# Patient Record
Sex: Male | Born: 1955 | Race: White | Hispanic: No | Marital: Married | Smoking: Current every day smoker
Health system: Southern US, Community
[De-identification: ages and names within clinical notes are randomized; demographics above are authoritative.]

## PROBLEM LIST (undated history)

## (undated) DIAGNOSIS — I10 Essential (primary) hypertension: Secondary | ICD-10-CM

## (undated) HISTORY — DX: Essential (primary) hypertension: I10

---

## 2011-06-30 ENCOUNTER — Inpatient Hospital Stay: Payer: Self-pay | Admitting: Internal Medicine

## 2020-10-03 ENCOUNTER — Ambulatory Visit: Payer: Self-pay | Admitting: Urology

## 2020-10-03 ENCOUNTER — Encounter: Payer: Self-pay | Admitting: Urology

## 2020-10-03 ENCOUNTER — Other Ambulatory Visit: Payer: Self-pay

## 2020-10-03 VITALS — BP 162/90 | Ht 68.0 in | Wt 197.0 lb

## 2020-10-03 DIAGNOSIS — R972 Elevated prostate specific antigen [PSA]: Secondary | ICD-10-CM

## 2020-10-03 DIAGNOSIS — N529 Male erectile dysfunction, unspecified: Secondary | ICD-10-CM

## 2020-10-03 LAB — BLADDER SCAN AMB NON-IMAGING

## 2020-10-03 NOTE — Patient Instructions (Addendum)
Prostate Cancer Screening  Prostate cancer screening is a test that is done to check for the presence of prostate cancer in men. The prostate gland is a walnut-sized gland that is located below the bladder and in front of the rectum in males. The function of the prostate is to add fluid to semen during ejaculation. Prostate cancer is the second most common type of cancer in men. Who should have prostate cancer screening?  Screening recommendations vary based on age and other risk factors. Screening is recommended if:  You are older than age 55. If you are age 55-69, talk with your health care provider about your need for screening and how often screening should be done. Because most prostate cancers are slow growing and will not cause death, screening is generally reserved in this age group for men who have a 10-15-year life expectancy.  You are younger than age 55, and you have these risk factors: ? Being a black male or a male of African descent. ? Having a father, brother, or uncle who has been diagnosed with prostate cancer. The risk is higher if your family member's cancer occurred at an early age. Screening is not recommended if:  You are younger than age 40.  You are between the ages of 40 and 54 and you have no risk factors.  You are 65 years of age or older. At this age, the risks that screening can cause are greater than the benefits that it may provide. If you are at high risk for prostate cancer, your health care provider may recommend that you have screenings more often or that you start screening at a younger age. How is screening for prostate cancer done? The recommended prostate cancer screening test is a blood test called the prostate-specific antigen (PSA) test. PSA is a protein that is made in the prostate. As you age, your prostate naturally produces more PSA. Abnormally high PSA levels may be caused by:  Prostate cancer.  An enlarged prostate that is not caused by cancer  (benign prostatic hyperplasia, BPH). This condition is very common in older men.  A prostate gland infection (prostatitis). Depending on the PSA results, you may need more tests, such as:  A physical exam to check the size of your prostate gland.  Blood and imaging tests.  A procedure to remove tissue samples from your prostate gland for testing (biopsy). What are the benefits of prostate cancer screening?  Screening can help to identify cancer at an early stage, before symptoms start and when the cancer can be treated more easily.  There is a small chance that screening may lower your risk of dying from prostate cancer. The chance is small because prostate cancer is a slow-growing cancer, and most men with prostate cancer die from a different cause. What are the risks of prostate cancer screening? The main risk of prostate cancer screening is diagnosing and treating prostate cancer that would never have caused any symptoms or problems. This is called overdiagnosisand overtreatment. PSA screening cannot tell you if your PSA is high due to cancer or a different cause. A prostate biopsy is the only procedure to diagnose prostate cancer. Even the results of a biopsy may not tell you if your cancer needs to be treated. Slow-growing prostate cancer may not need any treatment other than monitoring, so diagnosing and treating it may cause unnecessary stress or other side effects. A prostate biopsy may also cause:  Infection or fever.  A false negative. This is   a result that shows that you do not have prostate cancer when you actually do have prostate cancer. Questions to ask your health care provider  When should I start prostate cancer screening?  What is my risk for prostate cancer?  How often do I need screening?  What type of screening tests do I need?  How do I get my test results?  What do my results mean?  Do I need treatment? Where to find more information  The American Cancer  Society: www.cancer.org  American Urological Association: www.auanet.org Contact a health care provider if:  You have difficulty urinating.  You have pain when you urinate or ejaculate.  You have blood in your urine or semen.  You have pain in your back or in the area of your prostate. Summary  Prostate cancer is a common type of cancer in men. The prostate gland is located below the bladder and in front of the rectum. This gland adds fluid to semen during ejaculation.  Prostate cancer screening may identify cancer at an early stage, when the cancer can be treated more easily.  The prostate-specific antigen (PSA) test is the recommended screening test for prostate cancer.  Discuss the risks and benefits of prostate cancer screening with your health care provider. If you are age 65 or older, the risks that screening can cause are greater than the benefits that it may provide. This information is not intended to replace advice given to you by your health care provider. Make sure you discuss any questions you have with your health care provider. Document Revised: 06/01/2019 Document Reviewed: 06/01/2019 Elsevier Patient Education  2020 Elsevier Inc.      Transrectal Prostate Biopsy Patient Education and Post Procedure Instructions    -Definition A prostate biopsy is the removal of a small amount of tissue from the prostate gland. The tissue is examined to determine whether there is cancer.  -Reasons for Procedure A prostate biopsy is usually done after an abnormal finding by: Digital rectal exam Prostate specific antigen (PSA) blood test A prostate biopsy is the only way to find out if cancer cells are present.  -Possible Complications Problems from the procedure are rare, but all procedures have some risk including: Infection Bruising or lengthy bleeding from the rectum, or in urine or semen Difficulty urinating Reactions to anesthesia Factors that may increase the  risk of complications include: Smoking History of bleeding disorders or easy bruising Use of any medications, over-the-counter medications, or herbal supplements Sensitivity or allergy to latex, medications, or anesthesia.  -Prior to Procedure Talk to your doctor about your medications. Blood thinning medications including aspirin should be stopped 1 week prior to procedure. If prescribed by your cardiologist we may need approval before stopping medications. Use a Fleets enema 2 hours before the procedure. Can be purchased at your pharmacy. Antibiotics will be administered in the clinic prior to procedure.  Please make sure you eat a light meal prior to coming in for your appointment. This can help prevent lightheadedness during the procedure and upset stomach from antibiotics. Please bring someone with you to the procedure to drive you home.  -Anesthesia Transrectal biopsy: Local anesthesia--Just the area that is being operated on is numbed using an injectable anesthetic.  -Description of the Procedure Transrectal biopsy--Your doctor will insert a small ultrasound device into the rectum. This device will produce sound waves to create an image of the prostate. These images will help guide placement of the needle. Your doctor will then insert the   needle through the wall of the rectum and into the prostate gland. The procedure should take approximately 15-30 minutes.  -Will It Hurt? You may have discomfort and soreness at the biopsy site. Pain and discomfort after the procedure can be managed with medications.  -Postoperative Care When you return home after the procedure, do the following to help ensure a smooth recovery: Stay hydrated. Drink plenty of fluids for the next few days. Avoid difficult physical activity the day and evening of the procedure. Keep in mind that you may see blood in your urine, stool, or semen for several days. Resume any medications that were stopped when you are  advised to do so.  After the sample is taken, it will be sent to a pathologist for examination under a microscope. This doctor will analyze the sample for cancer. You will be scheduled for an appointment to discuss results. If cancer is present, your doctor will work with you to develop a treatment plan.   -Call Your Doctor or Seek Immediate Medical Attention It is important to monitor your recovery. Alert your doctor to any problems. If any of the following occur, call your doctor or go to the emergency room: Fever 100.5 or greater within 1 week post procedure go directly to ER Call the office for: Blood in the urine more than 1 week or in semen for more than 6 weeks post-biopsy Pain that you cannot control with the medications you have been given Pain, burning, urgency, or frequency of urination Cough, shortness of breath, or chest pain- if severe go to ER Heavy rectal bleeding or bleeding that lasts more than 1 week after the biopsy If you have any questions or concerns please contact our office at [phone]  [facility] [address], [address] [place], [place] [address] [phone]      

## 2020-10-03 NOTE — Progress Notes (Signed)
   10/03/20 11:05 AM   Sean Reilly 12/17/1955 [id number]  CC: Elevated PSA  HPI: I saw Sean Reilly in urology clinic today for evaluation of an elevated PSA.  He is a 64 year old male with a 45-pack-year smoking history who was recently found to have a isolated elevated PSA of 21 on 09/16/2020.  Notably, he says he was riding a tractor the day before his PSA was drawn.  There are no prior PSA values to review.  He denies any significant urinary symptoms, and IPSS score today is 10 with quality of life mixed.  He also has trouble with erections over the last few years, and is rarely sexually active.  He denies any family history of prostate or breast cancer.  He denies any bone pain or weight loss.  He denies any prior urologic or abdominal surgeries  Physical Exam: BP (!) 162/90 (BP Location: Left Arm, Patient Position: Sitting, Cuff Size: Large)   Ht 5\' 8"  (1.727 m)   Wt 197 lb (89.4 kg)   BMI 29.95 kg/m    Constitutional:  Alert and oriented, No acute distress. Cardiovascular: No clubbing, cyanosis, or edema. Respiratory: Normal respiratory effort, no increased work of breathing. GI: Abdomen is soft, nontender, nondistended, no abdominal masses GU: Phallus with patent meatus, no lesions, testicles 20 cc and descended bilaterally DRE: 40 g, smooth, no nodules or masses  Laboratory Data: Reviewed, PSA 21 on 09/16/2020  Pertinent Imaging: None to review  Assessment & Plan:   64 year old relatively healthy male with a single elevated PSA of 21, no family history of prostate or breast cancer, and benign DRE.  We reviewed the implications of an elevated PSA and the uncertainty surrounding it. In general, a man's PSA increases with age and is produced by both normal and cancerous prostate tissue. The differential diagnosis for elevated PSA includes BPH, prostate cancer, infection, recent intercourse/ejaculation, recent urethroscopic manipulation (foley placement/cystoscopy) or  trauma, and prostatitis.   Management of an elevated PSA can include observation or prostate biopsy and we discussed this in detail. Our goal is to detect clinically significant prostate cancers, and manage with either active surveillance, surgery, or radiation for localized disease. Risks of prostate biopsy include bleeding, infection (including life threatening sepsis), pain, and lower urinary symptoms. Hematuria, hematospermia, and blood in the stool are all common after biopsy and can persist up to 4 weeks.   Repeat PSA today with reflex to free.  We discussed the need to move forward with biopsy if this remains significantly elevated, however if it is decreased significantly we can trend the PSA to confirm it drops all the way back down to normal   75, MD 10/03/2020  [facility] [address] [place], [place] [phone]

## 2020-10-04 LAB — URINALYSIS, COMPLETE
Bilirubin, UA: NEGATIVE
Glucose, UA: NEGATIVE
Ketones, UA: NEGATIVE
Leukocytes,UA: NEGATIVE
Nitrite, UA: NEGATIVE
RBC, UA: NEGATIVE
Specific Gravity, UA: 1.015 (ref 1.005–1.030)
Urobilinogen, Ur: 0.2 mg/dL (ref 0.2–1.0)
pH, UA: 5.5 (ref 5.0–7.5)

## 2020-10-04 LAB — MICROSCOPIC EXAMINATION
Bacteria, UA: NONE SEEN
Epithelial Cells (non renal): NONE SEEN /hpf (ref 0–10)

## 2020-10-04 LAB — PSA TOTAL (REFLEX TO FREE): Prostate Specific Ag, Serum: 16.8 ng/mL — ABNORMAL HIGH (ref 0.0–4.0)

## 2020-10-08 ENCOUNTER — Telehealth: Payer: Self-pay

## 2020-10-08 NOTE — Telephone Encounter (Signed)
-----   Message from [name], MD sent at 10/07/2020 10:16 PM EST ----- PSA remains significantly elevated at 17, please schedule prostate biopsy and review instructions  Legrand Rams, MD 10/07/2020

## 2020-10-08 NOTE — Telephone Encounter (Signed)
Called pt, no answer. Unable to leave detailed message as no DPR is on file. Left generic message to return call to office. 1st attempt.

## 2020-10-10 NOTE — Telephone Encounter (Signed)
Called pt informed him of the information below. Pt gave verbal understanding. Pt would like to wait until after the holidays. Appt scheduled. No clearance needed. Appts mailed. Reviewed bx instructions, copy mailed as well.

## 2020-11-07 ENCOUNTER — Other Ambulatory Visit: Payer: Self-pay

## 2020-11-07 ENCOUNTER — Ambulatory Visit (INDEPENDENT_AMBULATORY_CARE_PROVIDER_SITE_OTHER): Payer: Medicare Other | Admitting: Urology

## 2020-11-07 ENCOUNTER — Encounter: Payer: Self-pay | Admitting: Urology

## 2020-11-07 VITALS — BP 211/97 | HR 76 | Ht 68.0 in | Wt 197.0 lb

## 2020-11-07 DIAGNOSIS — R972 Elevated prostate specific antigen [PSA]: Secondary | ICD-10-CM | POA: Diagnosis not present

## 2020-11-07 MED ORDER — LEVOFLOXACIN 500 MG PO TABS
500.0000 mg | ORAL_TABLET | Freq: Once | ORAL | Status: AC
  Administered 2020-11-07: 500 mg via ORAL

## 2020-11-07 MED ORDER — GENTAMICIN SULFATE 40 MG/ML IJ SOLN
80.0000 mg | Freq: Once | INTRAMUSCULAR | Status: AC
  Administered 2020-11-07: 80 mg via INTRAMUSCULAR

## 2020-11-07 NOTE — Progress Notes (Signed)
   11/07/20  Indication: Elevated PSA  Prostate Biopsy Procedure   Informed consent was obtained, and we discussed the risks of bleeding and infection/sepsis. A time out was performed to ensure correct patient identity.  Pre-Procedure: - Last PSA Level: 16.8 - Gentamicin and levaquin given for antibiotic prophylaxis - Transrectal Ultrasound performed revealing a 74 gm prostate, PSA density 0.23 - No significant hypoechoic region - Small median lobe  Procedure: - Prostate block performed using 10 cc 1% lidocaine and biopsies taken from sextant areas, a total of 12 under ultrasound guidance.  Post-Procedure: - Patient tolerated the procedure well - He was counseled to seek immediate medical attention if experiences significant bleeding, fevers, or severe pain - Return in one week to discuss biopsy results  Assessment/ Plan: Will follow up in 1-2 weeks to discuss pathology  Legrand Rams, MD 11/07/2020

## 2020-11-09 ENCOUNTER — Other Ambulatory Visit: Payer: Self-pay

## 2020-11-09 ENCOUNTER — Emergency Department
Admission: EM | Admit: 2020-11-09 | Discharge: 2020-11-09 | Disposition: A | Discharge status: Home / Self Care | Payer: Medicare Other | Attending: Emergency Medicine | Admitting: Emergency Medicine

## 2020-11-09 ENCOUNTER — Emergency Department: Payer: Medicare Other

## 2020-11-09 DIAGNOSIS — M791 Myalgia, unspecified site: Secondary | ICD-10-CM | POA: Insufficient documentation

## 2020-11-09 DIAGNOSIS — R509 Fever, unspecified: Secondary | ICD-10-CM | POA: Insufficient documentation

## 2020-11-09 DIAGNOSIS — R110 Nausea: Secondary | ICD-10-CM | POA: Diagnosis not present

## 2020-11-09 DIAGNOSIS — R319 Hematuria, unspecified: Secondary | ICD-10-CM | POA: Diagnosis not present

## 2020-11-09 DIAGNOSIS — F17210 Nicotine dependence, cigarettes, uncomplicated: Secondary | ICD-10-CM | POA: Insufficient documentation

## 2020-11-09 DIAGNOSIS — R5383 Other fatigue: Secondary | ICD-10-CM | POA: Diagnosis not present

## 2020-11-09 DIAGNOSIS — Z20822 Contact with and (suspected) exposure to covid-19: Secondary | ICD-10-CM | POA: Diagnosis not present

## 2020-11-09 DIAGNOSIS — N3090 Cystitis, unspecified without hematuria: Secondary | ICD-10-CM

## 2020-11-09 LAB — COMPREHENSIVE METABOLIC PANEL
ALT: 52 U/L — ABNORMAL HIGH (ref 0–44)
AST: 84 U/L — ABNORMAL HIGH (ref 15–41)
Albumin: 3.7 g/dL (ref 3.5–5.0)
Alkaline Phosphatase: 127 U/L — ABNORMAL HIGH (ref 38–126)
Anion gap: 12 (ref 5–15)
BUN: 24 mg/dL — ABNORMAL HIGH (ref 8–23)
CO2: 18 mmol/L — ABNORMAL LOW (ref 22–32)
Calcium: 8.9 mg/dL (ref 8.9–10.3)
Chloride: 100 mmol/L (ref 98–111)
Creatinine, Ser: 1.49 mg/dL — ABNORMAL HIGH (ref 0.61–1.24)
GFR, Estimated: 52 mL/min — ABNORMAL LOW (ref >60)
Glucose, Bld: 131 mg/dL — ABNORMAL HIGH (ref 70–99)
Potassium: 3.7 mmol/L (ref 3.5–5.1)
Sodium: 130 mmol/L — ABNORMAL LOW (ref 135–145)
Total Bilirubin: 1.7 mg/dL — ABNORMAL HIGH (ref 0.3–1.2)
Total Protein: 7.6 g/dL (ref 6.5–8.1)

## 2020-11-09 LAB — URINALYSIS, COMPLETE (UACMP) WITH MICROSCOPIC
Bilirubin Urine: NEGATIVE
Glucose, UA: NEGATIVE mg/dL
Ketones, ur: 20 mg/dL — AB
Nitrite: POSITIVE — AB
Protein, ur: 100 mg/dL — AB
RBC / HPF: >50 RBC/hpf — ABNORMAL HIGH (ref 0–5)
Specific Gravity, Urine: 1.025 (ref 1.005–1.030)
Squamous Epithelial / HPF: NONE SEEN (ref 0–5)
pH: 5 (ref 5.0–8.0)

## 2020-11-09 LAB — CBC WITH DIFFERENTIAL/PLATELET
Abs Immature Granulocytes: 0.06 10*3/uL (ref 0.00–0.07)
Basophils Absolute: 0 10*3/uL (ref 0.0–0.1)
Basophils Relative: 0 %
Eosinophils Absolute: 0 10*3/uL (ref 0.0–0.5)
Eosinophils Relative: 0 %
HCT: 47.1 % (ref 39.0–52.0)
Hemoglobin: 15.9 g/dL (ref 13.0–17.0)
Immature Granulocytes: 1 %
Lymphocytes Relative: 4 %
Lymphs Abs: 0.4 10*3/uL — ABNORMAL LOW (ref 0.7–4.0)
MCH: 28.2 pg (ref 26.0–34.0)
MCHC: 33.8 g/dL (ref 30.0–36.0)
MCV: 83.5 fL (ref 80.0–100.0)
Monocytes Absolute: 0.2 10*3/uL (ref 0.1–1.0)
Monocytes Relative: 2 %
Neutro Abs: 9.5 10*3/uL — ABNORMAL HIGH (ref 1.7–7.7)
Neutrophils Relative %: 93 %
Platelets: 194 10*3/uL (ref 150–400)
RBC: 5.64 MIL/uL (ref 4.22–5.81)
RDW: 12.9 % (ref 11.5–15.5)
WBC: 10.2 10*3/uL (ref 4.0–10.5)
nRBC: 0 % (ref 0.0–0.2)

## 2020-11-09 LAB — LACTIC ACID, PLASMA
Lactic Acid, Venous: 1.5 mmol/L (ref 0.5–1.9)
Lactic Acid, Venous: 1.5 mmol/L (ref 0.5–1.9)

## 2020-11-09 MED ORDER — CEPHALEXIN 500 MG PO CAPS: 500.0000 mg | ORAL_CAPSULE | Freq: Three times a day (TID) | ORAL | 0 refills | Status: DC

## 2020-11-09 MED ORDER — SODIUM CHLORIDE 0.9 % IV BOLUS
1000.0000 mL | Freq: Once | INTRAVENOUS | Status: AC
  Administered 2020-11-09: 1000 mL via INTRAVENOUS

## 2020-11-09 MED ORDER — CEPHALEXIN 500 MG PO CAPS
500.0000 mg | ORAL_CAPSULE | Freq: Once | ORAL | Status: AC
  Administered 2020-11-09: 500 mg via ORAL
  Filled 2020-11-09: qty 1

## 2020-11-09 MED ORDER — ONDANSETRON 4 MG PO TBDP: 4.0000 mg | ORAL_TABLET | Freq: Three times a day (TID) | ORAL | 0 refills | Status: DC | PRN

## 2020-11-09 NOTE — ED Triage Notes (Signed)
Pt comes pov with a fever and chills. Pt had prostate procedure Thursday and the next day spiked a high fever, pt states as high as 104.

## 2020-11-09 NOTE — ED Provider Notes (Signed)
[facility] Emergency Department Provider Note  ____________________________________________  Time seen: Approximately 8:12 PM  I have reviewed the triage vital signs and the nursing notes.   HISTORY  Chief Complaint Fever    HPI Sean Reilly is a 65 y.o. male who presents the emergency department complaining of body aches, fevers, chills, nausea, generalized malaise.  Patient states that symptoms have been ongoing since yesterday.  Patient was unsure whether this may be related to a prostate biopsy he had performed 2 days ago.  Patient states that he was slightly uncomfortable following the biopsy and has had some hematuria which is expected but has not had any perineal pain or pain described as prostate pain.  Patient has had Tylenol for his fever.  No other medications.  Patient is unsure of any sick contacts.  He took a at home Covid test earlier today which was negative.  Patient denies any visual changes, chest pain or shortness of breath, abdominal pain.  Again patient is nauseated but no emesis.  No reported diarrhea.  No dysuria but does have hematuria         History reviewed. No pertinent past medical history.  There are no problems to display for this patient.   History reviewed. No pertinent surgical history.  Prior to Admission medications   Medication Sig Start Date End Date Taking? Authorizing Provider  losartan (COZAAR) 100 MG tablet Take 100 mg by mouth daily.    [provider]    Allergies Patient has no known allergies.  History reviewed. No pertinent family history.  Social History Social History   Tobacco Use  . Smoking status: Current Every Day Smoker    Packs/day: 1.00    Years: 45.00    Pack years: 45.00    Types: Cigarettes  . Smokeless tobacco: Never Used  Substance Use Topics  . Alcohol use: Not Currently  . Drug use: Never     Review of Systems  Constitutional: Positive fever/chills.  Positive for  generalized aches and malaise Eyes: No visual changes. No discharge ENT: No upper respiratory complaints. Cardiovascular: no chest pain. Respiratory: no cough. No SOB. Gastrointestinal: No abdominal pain.  Positive nausea, no vomiting.  No diarrhea.  No constipation. Genitourinary: Negative for dysuria.  Positive for hematuria following prostate biopsy Musculoskeletal: Negative for musculoskeletal pain. Skin: Negative for rash, abrasions, lacerations, ecchymosis. Neurological: Negative for headaches, focal weakness or numbness.  10 System ROS otherwise negative.  ____________________________________________   PHYSICAL EXAM:  VITAL SIGNS: ED Triage Vitals  Enc Vitals Group     BP 11/09/20 1550 (!) 168/86     Pulse Rate 11/09/20 1550 (!) 144     Resp 11/09/20 1550 18     Temp 11/09/20 1550 99 F (37.2 C)     Temp Source 11/09/20 1550 Oral     SpO2 11/09/20 1550 96 %     Weight 11/09/20 1551 198 lb 6.6 oz (90 kg)     Height 11/09/20 1551 5\' 8"  (1.727 m)     Head Circumference --      Peak Flow --      Pain Score 11/09/20 1550 2     Pain Loc --      Pain Edu? --      Excl. in GC? --      Constitutional: Alert and oriented. Well appearing and in no acute distress. Eyes: Conjunctivae are normal. PERRL. EOMI. Head: Atraumatic. ENT:      Ears:  Nose: No congestion/rhinnorhea.      Mouth/Throat: Mucous membranes are moist.  Neck: No stridor.   Hematological/Lymphatic/Immunilogical: No cervical lymphadenopathy Cardiovascular: Normal rate, regular rhythm. Normal S1 and S2.  Good peripheral circulation. Respiratory: Normal respiratory effort without tachypnea or retractions. Lungs CTAB. Good air entry to the bases with no decreased or absent breath sounds. Gastrointestinal: Bowel sounds 4 quadrants. Soft and nontender to palpation. No guarding or rigidity. No palpable masses. No distention.  Musculoskeletal: Full range of motion to all extremities. No gross deformities  appreciated. Neurologic:  Normal speech and language. No gross focal neurologic deficits are appreciated.  Skin:  Skin is warm, dry and intact. No rash noted. Psychiatric: Mood and affect are normal. Speech and behavior are normal. Patient exhibits appropriate insight and judgement.   ____________________________________________   LABS (all labs ordered are listed, but only abnormal results are displayed)  Labs Reviewed  COMPREHENSIVE METABOLIC PANEL - Abnormal; Notable for the following components:      Result Value   Sodium 130 (*)    CO2 18 (*)    Glucose, Bld 131 (*)    BUN 24 (*)    Creatinine, Ser 1.49 (*)    AST 84 (*)    ALT 52 (*)    Alkaline Phosphatase 127 (*)    Total Bilirubin 1.7 (*)    GFR, Estimated 52 (*)    All other components within normal limits  CBC WITH DIFFERENTIAL/PLATELET - Abnormal; Notable for the following components:   Neutro Abs 9.5 (*)    Lymphs Abs 0.4 (*)    All other components within normal limits  SARS CORONAVIRUS 2 (TAT 6-24 HRS)  LACTIC ACID, PLASMA  LACTIC ACID, PLASMA  URINALYSIS, COMPLETE (UACMP) WITH MICROSCOPIC   ____________________________________________  EKG   ____________________________________________  RADIOLOGY I personally viewed and evaluated these images as part of my medical decision making, as well as reviewing the written report by the radiologist.  ED Provider Interpretation: No consolidation concerning for pneumonia  DG Chest 2 View  Result Date: 11/09/2020 CLINICAL DATA:  Fever and chills. EXAM: CHEST - 2 VIEW COMPARISON:  None. FINDINGS: Heart size and mediastinal contours are within normal limits. Lungs are clear. No pleural effusion or pneumothorax is seen. Mild degenerative spondylosis of the lower thoracic spine. No acute appearing osseous abnormality. IMPRESSION: No active cardiopulmonary disease. No evidence of pneumonia. Electronically Signed   By: Bary Richard M.D.   On: 11/09/2020 16:17     ____________________________________________    PROCEDURES  Procedure(s) performed:    Procedures    Medications  sodium chloride 0.9 % bolus 1,000 mL (1,000 mLs Intravenous New Bag/Given 11/09/20 2041)     ____________________________________________   INITIAL IMPRESSION / ASSESSMENT AND PLAN / ED COURSE  Pertinent labs & imaging results that were available during my care of the patient were reviewed by me and considered in my medical decision making (see chart for details).  Review of the [place] CSRS was performed in accordance of the [facility] prior to dispensing any controlled drugs.  Clinical Course as of 11/09/20 2149  Sat Nov 09, 2020  2032 Patient presents the emergency department complaining of generalized body aches, fevers and chills, nausea.  Patient is unsure whether this may be secondary to his prostate biopsy 2 days ago.  Patient is having no increased pain in the region of his prostate.  He is experiencing hematuria which is expected following a prostate biopsy.  No dysuria.  Patient was febrile at home, has  taken Tylenol and arrives with temperature of 99 F.  Patient was tachycardic at that time.  Patient is currently resting comfortably.  He has had generalized aches, malaise, fevers, nausea but no emesis.  No elevation in white blood cell count or lactic acid concerning for sepsis.  Patient does have a bump in his LFTs and decrease in sodium.  At this time exam was reassuring without acute findings.  Patient will be given fluids, Covid test and still awaiting urinalysis at this time. [JC]    Clinical Course User Index [JC] Kijana Estock, Delorise Royals, PA-C           Patient presented to emergency department with fevers, malaise, nausea, body aches x2 days.  Patient symptoms began yesterday.  Patient was concerned as he had a biopsy of his prostate on Thursday.  He experienced some soreness following the biopsy, has had some hematuria which is to both be expected after  his biopsy.  However he said no increased perineal/prostate pain.  He is having no dysuria.  Symptoms began with headache, body aches, fever, chills, nausea.  There is no emesis, diarrhea or constipation.  Patient has had no cough or shortness of breath.  Patient is been using Tylenol for his symptoms.  On arrival patient's vital signs showed tachycardia, hypertension.  He has had some mild tachypnea as well but maintains his O2 saturation in the mid to upper nineties on room air.  Patient is tachycardia did trend down after he was roomed and had fluids.  Urinalysis is still pending but I have a lower suspicion for prostatitis versus viral illness.  Patient has been Covid tested and results are also still pending.  Remainder of labs are reassuring at this time.  No elevation of his lactic acid, white blood cell count within the normal range.  LFTs are up which would further support a Covid diagnosis.  There is no pain to be concerned for choledocholithiasis at this time.  No previous labs to compare liver function tests with however.  Patient did have a sodium of 130 but again has been given a liter of fluids.  At this time results are still pending and patient care will be turned over to Dr. Scotty Court for final diagnosis and disposition.  Dr. Scotty Court is aware of the patient's history, physical exam, lab results to this point.   This chart was dictated using voice recognition software/Dragon. Despite best efforts to proofread, errors can occur which can change the meaning. Any change was purely unintentional.    Lanette Hampshire 11/09/20 2149    Sharman Cheek, MD 11/09/20 2340

## 2020-11-09 NOTE — ED Notes (Signed)
One set of cultures sent  ?

## 2020-11-10 LAB — SARS CORONAVIRUS 2 (TAT 6-24 HRS): SARS Coronavirus 2: NEGATIVE

## 2020-11-11 LAB — SURGICAL PATHOLOGY

## 2020-11-12 LAB — URINE CULTURE: Culture: >=100000 — AB

## 2020-11-14 ENCOUNTER — Other Ambulatory Visit: Payer: Self-pay

## 2020-11-14 ENCOUNTER — Encounter: Payer: Self-pay | Admitting: Urology

## 2020-11-14 ENCOUNTER — Ambulatory Visit (INDEPENDENT_AMBULATORY_CARE_PROVIDER_SITE_OTHER): Payer: Medicare Other | Admitting: Urology

## 2020-11-14 VITALS — BP 154/75 | HR 89 | Ht 68.0 in | Wt 197.0 lb

## 2020-11-14 DIAGNOSIS — B9620 Unspecified Escherichia coli [E. coli] as the cause of diseases classified elsewhere: Secondary | ICD-10-CM

## 2020-11-14 DIAGNOSIS — N390 Urinary tract infection, site not specified: Secondary | ICD-10-CM

## 2020-11-14 DIAGNOSIS — R972 Elevated prostate specific antigen [PSA]: Secondary | ICD-10-CM

## 2020-11-14 MED ORDER — TAMSULOSIN HCL 0.4 MG PO CAPS: 0.4000 mg | ORAL_CAPSULE | Freq: Every day | ORAL | 0 refills | Status: DC

## 2020-11-14 MED ORDER — CEPHALEXIN 500 MG PO CAPS: 500.0000 mg | ORAL_CAPSULE | Freq: Two times a day (BID) | ORAL | 0 refills | Status: AC

## 2020-11-14 NOTE — Progress Notes (Signed)
   11/14/2020 11:10 AM   Jacquenette Shone 16-Jun-1956 [id number]  Reason for visit: Follow up prostate biopsy results  HPI: I saw Mr. Ferraris and his wife in urology clinic for follow-up of his prostate biopsy results.  He initially presented with a PSA of 21, and on repeat remained elevated at 16.8.  He underwent a biopsy on 11/07/2020 showing a 74 g gland with PSA density of 0.23, and all cores showed only benign prostatic tissue.  He presented to the ED 2 days later with fevers and malaise, and was found to have a UTI.  This was multidrug-resistant E. coli, including resistant to the preprocedure antibiotics of Cipro and gentamicin.  He was discharged on Keflex for 7 days.  He is feeling much better, aside from some mild dysuria and weak stream.  We reviewed his negative prostate biopsy results, and the approximately 15 to 20% false-negative rate, need for continued PSA monitoring moving forward.  Regarding his postbiopsy UTI, I recommended extending the Keflex to a 14-day course total, as well as a 2-week course of Flomax.  Return precautions discussed extensively.  RTC 6 months PSA prior, IPSS, PVR   [name], MD  [facility] [address] [place], [place] [address] [phone]

## 2021-05-14 ENCOUNTER — Other Ambulatory Visit: Payer: Self-pay

## 2021-05-14 ENCOUNTER — Other Ambulatory Visit: Payer: Medicare Other

## 2021-05-14 DIAGNOSIS — R972 Elevated prostate specific antigen [PSA]: Secondary | ICD-10-CM

## 2021-05-15 LAB — PSA, TOTAL AND FREE
PSA, Free Pct: 12.4 %
PSA, Free: 2.64 ng/mL
Prostate Specific Ag, Serum: 21.3 ng/mL — ABNORMAL HIGH (ref 0.0–4.0)

## 2021-05-15 LAB — SPECIMEN STATUS REPORT

## 2021-05-21 ENCOUNTER — Ambulatory Visit: Payer: Medicare Other | Admitting: Urology

## 2021-05-21 ENCOUNTER — Other Ambulatory Visit: Payer: Self-pay

## 2021-05-21 ENCOUNTER — Encounter: Payer: Self-pay | Admitting: Urology

## 2021-05-21 VITALS — BP 204/103 | HR 74 | Ht 68.0 in | Wt 191.0 lb

## 2021-05-21 DIAGNOSIS — R3989 Other symptoms and signs involving the genitourinary system: Secondary | ICD-10-CM

## 2021-05-21 DIAGNOSIS — R972 Elevated prostate specific antigen [PSA]: Secondary | ICD-10-CM

## 2021-05-21 DIAGNOSIS — Z87440 Personal history of urinary (tract) infections: Secondary | ICD-10-CM | POA: Diagnosis not present

## 2021-05-21 MED ORDER — CEPHALEXIN 500 MG PO CAPS: 500.0000 mg | ORAL_CAPSULE | Freq: Two times a day (BID) | ORAL | 0 refills | Status: AC

## 2021-05-21 NOTE — Patient Instructions (Signed)
Your blood pressure is very elevated today.  This can cause a number of problems long-term, and it is extremely important that you follow-up with your primary care physician to find a different blood pressure medication since you stopped the losartan.  Prostate Cancer Screening  Prostate cancer screening is a test that is done to check for the presence of prostate cancer in men. The prostate gland is a walnut-sized gland that is located below the bladder and in front of the rectum in males. The function of the prostate is to add fluid to semen during ejaculation. Prostate cancer isthe second most common type of cancer in men. Who should have prostate cancer screening?  Screening recommendations vary based on age and other risk factors. Screening is recommended if: You are older than age 66. If you are age 40-69, talk with your health care provider about your need for screening and how often screening should be done. Because most prostate cancers are slow growing and will not cause death, screening is generally reserved in this age group for men who have a 10-15-year life expectancy. You are younger than age 19, and you have these risk factors: Being a black male or a male of African descent. Having a father, brother, or uncle who has been diagnosed with prostate cancer. The risk is higher if your family member's cancer occurred at an early age. Screening is not recommended if: You are younger than age 31. You are between the ages of 66 and 68 and you have no risk factors. You are 66 years of age or older. At this age, the risks that screening can cause are greater than the benefits that it may provide. If you are at high risk for prostate cancer, your health care provider may recommend that you have screenings more often or that you start screening at ayounger age. How is screening for prostate cancer done? The recommended prostate cancer screening test is a blood test called the prostate-specific  antigen (PSA) test. PSA is a protein that is made in the prostate. As you age, your prostate naturally produces more PSA. Abnormally high PSA levels may be caused by: Prostate cancer. An enlarged prostate that is not caused by cancer (benign prostatic hyperplasia, BPH). This condition is very common in older men. A prostate gland infection (prostatitis). Depending on the PSA results, you may need more tests, such as: A physical exam to check the size of your prostate gland. Blood and imaging tests. A procedure to remove tissue samples from your prostate gland for testing (biopsy). What are the benefits of prostate cancer screening? Screening can help to identify cancer at an early stage, before symptoms start and when the cancer can be treated more easily. There is a small chance that screening may lower your risk of dying from prostate cancer. The chance is small because prostate cancer is a slow-growing cancer, and most men with prostate cancer die from a different cause. What are the risks of prostate cancer screening? The main risk of prostate cancer screening is diagnosing and treating prostate cancer that would never have caused any symptoms or problems. This is called overdiagnosisand overtreatment. PSA screening cannot tell you if your PSA is high due to cancer or a different cause. A prostate biopsy is the only procedure to diagnose prostate cancer. Even the results of a biopsy may not tell you if your cancer needs to be treated. Slow-growing prostate cancer may not need any treatment other than monitoring, so diagnosing and treating  it may causeunnecessary stress or other side effects. A prostate biopsy may also cause: Infection or fever. A false negative. This is a result that shows that you do not have prostate cancer when you actually do have prostate cancer. Questions to ask your health care provider When should I start prostate cancer screening? What is my risk for prostate  cancer? How often do I need screening? What type of screening tests do I need? How do I get my test results? What do my results mean? Do I need treatment? Where to find more information The American Cancer Society: www.cancer.org American Urological Association: www.auanet.org Contact a health care provider if: You have difficulty urinating. You have pain when you urinate or ejaculate. You have blood in your urine or semen. You have pain in your back or in the area of your prostate. Summary Prostate cancer is a common type of cancer in men. The prostate gland is located below the bladder and in front of the rectum. This gland adds fluid to semen during ejaculation. Prostate cancer screening may identify cancer at an early stage, when the cancer can be treated more easily. The prostate-specific antigen (PSA) test is the recommended screening test for prostate cancer. Discuss the risks and benefits of prostate cancer screening with your health care provider. If you are age 66 or older, the risks that screening can cause are greater than the benefits that it may provide. This information is not intended to replace advice given to you by your health care provider. Make sure you discuss any questions you have with your healthcare provider. Document Revised: 10/17/2020 Document Reviewed: 06/01/2019 Elsevier Patient Education  2022 [employer].

## 2021-05-21 NOTE — Progress Notes (Signed)
   05/21/2021 9:44 AM   Sean Reilly 02/17/1956 [id number]  Reason for visit: Follow up elevated PSA, urinary symptoms  HPI: I saw [name] back for follow-up of elevated PSA.  Briefly, he initially presented with an elevated PSA of 21, and on repeat this remains elevated at 16.8.  He underwent a biopsy in January 2022 that showed a 74 g gland with a PSA density of 0.23, and all cores showed only benign tissue.  This was complicated by a post procedure UTI and treated with 2 weeks of Keflex.  This did not require admission.   Repeat PSA in July 2022 remains elevated at 21.3.  He continues to have some urinary frequency today.  I checked a urinalysis which was suspicious for infection with greater than 30 WBCs, 0-2 RBCs, many bacteria, nitrite positive, 2+ leukocytes.  Will send for culture and atypicals.  We started him on Keflex 500 mg twice daily for 3 weeks for possible prostatitis that may be causing his persistently elevated PSA and urinary symptoms(prior E. coli was multidrug-resistant).  We discussed the possible causes of his persistently elevated PSA including UTI/prostatitis, inflammation, or malignancy.  I recommended close follow-up in 2 months for symptom check regarding his urinary symptoms, repeat urinalysis, and repeat PSA.  Consider 4K score in the future if PSA remains elevated, versus prostate MRI.  Keflex 500 mg twice daily x3 weeks for UTI RTC 2 months with symptom check, PVR, UA, PSA prior   [name], MD  [facility] [address] [place], [place] [address] [phone]

## 2021-05-22 LAB — MICROSCOPIC EXAMINATION
WBC, UA: >30 /hpf — AB (ref 0–5)
Yeast, UA: NONE SEEN

## 2021-05-22 LAB — URINALYSIS, COMPLETE
Bilirubin, UA: NEGATIVE
Glucose, UA: NEGATIVE
Ketones, UA: NEGATIVE
Nitrite, UA: POSITIVE — AB
Specific Gravity, UA: 1.015 (ref 1.005–1.030)
Urobilinogen, Ur: 0.2 mg/dL (ref 0.2–1.0)
pH, UA: 7 (ref 5.0–7.5)

## 2021-05-23 LAB — CULTURE, URINE COMPREHENSIVE

## 2021-05-28 LAB — MYCOPLASMA / UREAPLASMA CULTURE
Mycoplasma hominis Culture: NEGATIVE
Ureaplasma urealyticum: NEGATIVE

## 2021-07-22 ENCOUNTER — Other Ambulatory Visit: Payer: Medicare Other

## 2021-07-22 ENCOUNTER — Other Ambulatory Visit: Payer: Self-pay

## 2021-07-22 DIAGNOSIS — R972 Elevated prostate specific antigen [PSA]: Secondary | ICD-10-CM

## 2021-07-23 LAB — PSA: Prostate Specific Ag, Serum: 22.2 ng/mL — ABNORMAL HIGH (ref 0.0–4.0)

## 2021-08-21 ENCOUNTER — Ambulatory Visit: Payer: Medicare Other | Admitting: Urology

## 2021-08-21 ENCOUNTER — Encounter: Payer: Self-pay | Admitting: Urology

## 2021-08-21 ENCOUNTER — Other Ambulatory Visit: Payer: Self-pay

## 2021-08-21 VITALS — BP 158/68 | HR 81 | Ht 68.0 in | Wt 188.0 lb

## 2021-08-21 DIAGNOSIS — N390 Urinary tract infection, site not specified: Secondary | ICD-10-CM | POA: Diagnosis not present

## 2021-08-21 DIAGNOSIS — R972 Elevated prostate specific antigen [PSA]: Secondary | ICD-10-CM | POA: Diagnosis not present

## 2021-08-21 DIAGNOSIS — N138 Other obstructive and reflux uropathy: Secondary | ICD-10-CM | POA: Diagnosis not present

## 2021-08-21 DIAGNOSIS — N401 Enlarged prostate with lower urinary tract symptoms: Secondary | ICD-10-CM | POA: Diagnosis not present

## 2021-08-21 LAB — BLADDER SCAN AMB NON-IMAGING

## 2021-08-21 NOTE — Progress Notes (Signed)
   08/21/2021 4:26 PM   Sean Reilly [birth year] [id number]  Reason for visit: Follow up elevated PSA, urinary symptoms, UTI  HPI: Briefly, 65 year old male who initially presented with an elevated PSA of 21, and on repeat remained elevated at 16.8.He underwent a biopsy in January 2022 that showed a 74 g gland with a PSA density of 0.23, and all cores showed only benign tissue.  DRE was benign.  This was complicated by a post procedure UTI and treated with 2 weeks of Keflex.  This did not require admission.   Repeat PSA in July 2022 remained elevated at 21.3, but he had a UTI at that time and culture ultimately grew multidrug-resistant E. coli, and he was treated with 3 weeks of Keflex.  He reports this cleared up his cloudy urine.  He has persistent urinary symptoms that have been present for around a year of nocturia 3-4 times overnight and some weak stream with occasional dysuria.  PVR today normal at 34 mL.  He previously worked at [employer], and is very worried that he could have a malignancy from being exposed to chemicals there.  With his persistent urinary symptoms, UTIs, and persistently elevated PSA, I recommended cystoscopy for further evaluation.  If cystoscopy is normal may need to consider other imaging for elevated PSA including prostate MRI, or CT urogram for further evaluation of UTIs  RTC for cystoscopy Urinalysis today pending, call with results  [name], MD  [facility] [address], [address] [place], [place] [address] [phone]

## 2021-08-21 NOTE — Patient Instructions (Addendum)

## 2021-08-22 ENCOUNTER — Telehealth: Payer: Self-pay

## 2021-08-22 LAB — URINALYSIS, COMPLETE
Bilirubin, UA: NEGATIVE
Glucose, UA: NEGATIVE
Ketones, UA: NEGATIVE
Leukocytes,UA: NEGATIVE
Nitrite, UA: NEGATIVE
Protein,UA: NEGATIVE
RBC, UA: NEGATIVE
Specific Gravity, UA: <1.005 — ABNORMAL LOW (ref 1.005–1.030)
Urobilinogen, Ur: 0.2 mg/dL (ref 0.2–1.0)
pH, UA: 5.5 (ref 5.0–7.5)

## 2021-08-22 LAB — MICROSCOPIC EXAMINATION
Bacteria, UA: NONE SEEN
Epithelial Cells (non renal): NONE SEEN /hpf (ref 0–10)

## 2021-08-22 NOTE — Telephone Encounter (Signed)
Mychart notification sent 

## 2021-08-22 NOTE — Telephone Encounter (Signed)
-----   Message from [name], MD sent at 08/22/2021  7:02 AM EDT ----- UA normal, keep follow up for cysto  BS

## 2021-08-31 LAB — CULTURE, URINE COMPREHENSIVE

## 2021-09-10 ENCOUNTER — Other Ambulatory Visit: Payer: Self-pay

## 2021-09-10 ENCOUNTER — Encounter: Payer: Self-pay | Admitting: Urology

## 2021-09-10 ENCOUNTER — Ambulatory Visit (INDEPENDENT_AMBULATORY_CARE_PROVIDER_SITE_OTHER): Payer: Medicare Other | Admitting: Urology

## 2021-09-10 VITALS — BP 177/84 | HR 86 | Ht 68.0 in | Wt 185.0 lb

## 2021-09-10 DIAGNOSIS — N390 Urinary tract infection, site not specified: Secondary | ICD-10-CM | POA: Diagnosis not present

## 2021-09-10 DIAGNOSIS — N138 Other obstructive and reflux uropathy: Secondary | ICD-10-CM

## 2021-09-10 MED ORDER — NITROFURANTOIN MONOHYD MACRO 100 MG PO CAPS
100.0000 mg | ORAL_CAPSULE | Freq: Once | ORAL | Status: AC
  Administered 2021-09-10: 100 mg via ORAL

## 2021-09-10 NOTE — Addendum Note (Signed)
Addended by: Sueanne Margarita on: 09/10/2021 03:36 PM   Modules accepted: Orders

## 2021-09-10 NOTE — Progress Notes (Signed)
Cystoscopy Procedure Note:  Indication: Urinary symptoms, UTI  Interesting 65 year old male who had an elevated PSA of 21 and underwent a biopsy in January 2022 that showed a 74 g gland and only benign tissue.  He also had a multidrug-resistant E. coli UTI in July 2022.  Most recent urinalysis was completely benign.  He has persistent urinary symptoms of nocturia 3-4 times per night and some weak stream with dysuria.  After informed consent and discussion of the procedure and its risks, Colbin Jovel was positioned and prepped in the standard fashion. Cystoscopy was performed with a flexible cystoscope. The urethra, bladder neck and entire bladder was visualized in a standard fashion. The prostate was large with obstructing lateral lobes. The ureteral orifices were visualized in their normal location and orientation.  Thorough cystoscopy showed no suspicious lesions and mild trabeculations.  Retroflexion showed some intravesical protrusion of the prostate with some erythema at the intravesical portion of the prostate, but no definite tumors.  Cytology was sent.    Findings: Normal cystoscopy, mild prostate erythema on retroflexion  Assessment and Plan: Call with cytology results Consider prostate MRI in the future if PSA remains elevated Consider HOLEP if worsening urinary symptoms and work-up for malignancy negative  Legrand Rams, MD 09/10/2021

## 2021-09-10 NOTE — Progress Notes (Signed)
mo

## 2021-09-12 LAB — CYTOLOGY - NON PAP

## 2021-09-16 ENCOUNTER — Telehealth: Payer: Self-pay

## 2021-09-16 DIAGNOSIS — R972 Elevated prostate specific antigen [PSA]: Secondary | ICD-10-CM

## 2021-09-16 NOTE — Telephone Encounter (Signed)
-----   Message from [name], MD sent at 09/16/2021  8:15 AM EST ----- Good news, no cancer cells on urine specimen, recommend follow-up in 3 months with PSA reflex to free prior  Legrand Rams, MD 09/16/2021

## 2021-09-16 NOTE — Telephone Encounter (Signed)
Called pt informed him of the information below. Pt voiced understanding. PSA ordered. Appt scheduled.  

## 2021-12-12 ENCOUNTER — Other Ambulatory Visit: Payer: Medicare Other

## 2021-12-12 ENCOUNTER — Other Ambulatory Visit: Payer: Self-pay

## 2021-12-12 DIAGNOSIS — R972 Elevated prostate specific antigen [PSA]: Secondary | ICD-10-CM

## 2021-12-13 LAB — PSA TOTAL (REFLEX TO FREE): Prostate Specific Ag, Serum: 23 ng/mL — ABNORMAL HIGH (ref 0.0–4.0)

## 2021-12-17 ENCOUNTER — Encounter: Payer: Self-pay | Admitting: Urology

## 2021-12-17 ENCOUNTER — Other Ambulatory Visit: Payer: Self-pay

## 2021-12-17 ENCOUNTER — Ambulatory Visit: Payer: Medicare Other | Admitting: Urology

## 2021-12-17 VITALS — BP 177/83 | HR 81 | Ht 68.0 in | Wt 185.0 lb

## 2021-12-17 DIAGNOSIS — R972 Elevated prostate specific antigen [PSA]: Secondary | ICD-10-CM | POA: Diagnosis not present

## 2021-12-17 DIAGNOSIS — N390 Urinary tract infection, site not specified: Secondary | ICD-10-CM | POA: Diagnosis not present

## 2021-12-17 NOTE — Patient Instructions (Addendum)
Prostate MRI Prep: ° °1- No ejaculation 48 hours prior to exam ° °2- No food or drink or caffeine 4 hours prior to exam ° °3- Fleets enema needs to be done 4 hours prior to exam  ° °4- Urinate just prior to exam  ° ° ° ° ° °Prostate Cancer Screening °Prostate cancer screening is testing that is done to check for the presence of prostate cancer in men. The prostate gland is a walnut-sized gland that is located below the bladder and in front of the rectum in males. The function of the prostate is to add fluid to semen during ejaculation. Prostate cancer is one of the most common types of cancer in men. °Who should have prostate cancer screening? °Screening recommendations vary based on age and other risk factors, as well as between the professional organizations who make the recommendations. °In general, screening is recommended if: °You are age 50 to 70 and have an average risk for prostate cancer. You should talk with your health care provider about your need for screening and how often screening should be done. Because most prostate cancers are slow growing and will not cause death, screening in this age group is generally reserved for men who have a 10- to 15-year life expectancy. °You are younger than age 50, and you have these risk factors: °Having a father, brother, or uncle who has been diagnosed with prostate cancer. The risk is higher if your family member's cancer occurred at an early age or if you have multiple family members with prostate cancer at an early age. °Being a male who is Black or is of Caribbean or sub-Saharan African descent. °In general, screening is not recommended if: °You are younger than age 40. °You are between the ages of 40 and 49 and you have no risk factors. °You are 70 years of age or older. At this age, the risks that screening can cause are greater than the benefits that it may provide. °If you are at high risk for prostate cancer, your health care provider may recommend that you  have screenings more often or that you start screening at a younger age. °How is screening for prostate cancer done? °The recommended prostate cancer screening test is a blood test called the prostate-specific antigen (PSA) test. PSA is a protein that is made in the prostate. As you age, your prostate naturally produces more PSA. Abnormally high PSA levels may be caused by: °Prostate cancer. °An enlarged prostate that is not caused by cancer (benign prostatic hyperplasia, or BPH). This condition is very common in older men. °A prostate gland infection (prostatitis) or urinary tract infection. °Certain medicines such as male hormones (like testosterone) or other medicines that raise testosterone levels. °A rectal exam may be done as part of prostate cancer screening to help provide information about the size of your prostate gland. When a rectal exam is performed, it should be done after the PSA level is drawn to avoid any effect on the results. °Depending on the PSA results, you may need more tests, such as: °A physical exam to check the size of your prostate gland, if not done as part of screening. °Blood and imaging tests. °A procedure to remove tissue samples from your prostate gland for testing (biopsy). This is the only way to know for certain if you have prostate cancer. °What are the benefits of prostate cancer screening? °Screening can help to identify cancer at an early stage, before symptoms start and when the cancer can   be treated more easily. °There is a small chance that screening may lower your risk of dying from prostate cancer. The chance is small because prostate cancer is a slow-growing cancer, and most men with prostate cancer die from a different cause. °What are the risks of prostate cancer screening? °The main risk of prostate cancer screening is diagnosing and treating prostate cancer that would never have caused any symptoms or problems. This is called overdiagnosisand overtreatment. PSA  screening cannot tell you if your PSA is high due to cancer or a different cause. A prostate biopsy is the only procedure to diagnose prostate cancer. Even the results of a biopsy may not tell you if your cancer needs to be treated. Slow-growing prostate cancer may not need any treatment other than monitoring, so diagnosing and treating it may cause unnecessary stress or other side effects. °Questions to ask your health care provider °When should I start prostate cancer screening? °What is my risk for prostate cancer? °How often do I need screening? °What type of screening tests do I need? °How do I get my test results? °What do my results mean? °Do I need treatment? °Where to find more information °The American Cancer Society: www.cancer.org °American Urological Association: www.auanet.org °Contact a health care provider if: °You have difficulty urinating. °You have pain when you urinate or ejaculate. °You have blood in your urine or semen. °You have pain in your back or in the area of your prostate. °Summary °Prostate cancer is a common type of cancer in men. The prostate gland is located below the bladder and in front of the rectum. This gland adds fluid to semen during ejaculation. °Prostate cancer screening may identify cancer at an early stage, when the cancer can be treated more easily and is less likely to have spread to other areas of the body. °The prostate-specific antigen (PSA) test is the recommended screening test for prostate cancer, but it has associated risks. °Discuss the risks and benefits of prostate cancer screening with your health care provider. If you are age 70 or older, the risks that screening can cause are greater than the benefits that it may provide. °This information is not intended to replace advice given to you by your health care provider. Make sure you discuss any questions you have with your health care provider. °Document Revised: 04/14/2021 Document Reviewed:  04/14/2021 °Elsevier Patient Education © 2022 Elsevier Inc. ° °

## 2021-12-17 NOTE — Progress Notes (Signed)
° °  12/17/2021 11:31 AM   Sean Reilly 05-15-1956 [id number]  Reason for visit: Elevated PSA, urinary symptoms, UTIs  HPI: 66 year old male with interesting urologic history.  He underwent a prostate biopsy in January 2022 for an elevated PSA of 16.8 which showed a 74 g prostate with a PSA density of 0.23, and all cores showed only benign tissue.  This was complicated by UTI that did not require admission.  His UTI symptoms did not completely resolve after a 2-week course of Keflex prescribed by the ER, and urine culture 05/21/2021 showed MDR E. Coli(resistant to ampicillin, Cipro, gentamicin, Levaquin, Bactrim )worrisome for prostatitis.  He was then treated with a 3-week course of Keflex which improved his urinary symptoms.  I then saw him in October 2022 when he was having some persistent urinary symptoms despite a negative urinalysis and culture, and with his extensive smoking history we opted for cystoscopy to rule out bladder lesion.  Cystoscopy in November 2022 showed a large prostate with an lateral lobe hypertrophy, but no suspicious lesions, and cytology was negative.  His PSA has remained elevated, most recently 23 from 22 4 months ago, and 21 7 months ago.  We were trending the PSA with his history of culture documented prostatitis in July 2022.  We reviewed the AUA guidelines regarding PSA screening, and despite his negative biopsy we discussed the 10 to 20% false-negative rate, and with persistently elevated PSA recommended a prostate MRI for further evaluation.  He denies any urinary symptoms today aside from some weak stream in the morning.  Prostate MRI, call with results  I spent 30 total minutes on the day of the encounter including pre-visit review of the medical record, face-to-face time with the patient, and post visit ordering of labs/imaging/tests.   Billey Co, [facility] [address] [place], [place] [address] [phone]

## 2021-12-31 ENCOUNTER — Ambulatory Visit
Admission: RE | Admit: 2021-12-31 | Discharge: 2021-12-31 | Disposition: A | Discharge status: Home / Self Care | Payer: Medicare Other | Source: Ambulatory Visit | Attending: Urology | Admitting: Urology

## 2021-12-31 DIAGNOSIS — R972 Elevated prostate specific antigen [PSA]: Secondary | ICD-10-CM | POA: Diagnosis not present

## 2021-12-31 MED ORDER — GADOBUTROL 1 MMOL/ML IV SOLN
8.0000 mL | Freq: Once | INTRAVENOUS | Status: AC | PRN
  Administered 2021-12-31: 8 mL via INTRAVENOUS

## 2022-01-01 ENCOUNTER — Other Ambulatory Visit: Payer: Self-pay | Admitting: *Deleted

## 2022-01-01 DIAGNOSIS — R972 Elevated prostate specific antigen [PSA]: Secondary | ICD-10-CM

## 2022-02-19 ENCOUNTER — Other Ambulatory Visit: Payer: Self-pay | Admitting: Urology

## 2022-02-24 ENCOUNTER — Other Ambulatory Visit: Payer: Self-pay

## 2022-02-24 ENCOUNTER — Ambulatory Visit: Payer: Medicare Other | Admitting: Urology

## 2022-02-24 DIAGNOSIS — R972 Elevated prostate specific antigen [PSA]: Secondary | ICD-10-CM | POA: Diagnosis not present

## 2022-02-24 NOTE — Progress Notes (Signed)
Virtual Visit via Telephone Note ? ?I connected with Sean Reilly on 02/24/22 at  3:45 PM EDT by telephone and verified that I am speaking with the correct person using two identifiers. ?  ?Patient location: Home ?Provider location: [facility] ? ? ?I discussed the limitations, risks, security and privacy concerns of performing an evaluation and management service by telephone and the availability of in person appointments. We discussed the impact of the COVID-19 pandemic on the healthcare system, and the importance of social distancing and reducing patient and provider exposure. I also discussed with the patient that there may be a patient responsible charge related to this service. The patient expressed understanding and agreed to proceed. ? ?Reason for visit: ?MRI fusion biopsy results ? ?History of Present Illness: ?66 year old male with interesting urologic history, underwent a prostate biopsy in January 2022 for an elevated PSA of 16.8 which showed a 74 g prostate with a PSA density of 0.23, and all cores showed only benign tissue.  This was complicated by UTI that did not require admission.  His UTI symptoms did not completely resolve after a 2-week course of Keflex prescribed by the ER, and urine culture 05/21/2021 showed MDR E. Coli(resistant to ampicillin, Cipro, gentamicin, Levaquin, Bactrim )worrisome for prostatitis.  He was then treated with a 3-week course of Keflex which improved his urinary symptoms.  I then saw him in October 2022 when he was having some persistent urinary symptoms despite a negative urinalysis and culture, and with his extensive smoking history we opted for cystoscopy to rule out bladder lesion.  Cystoscopy in November 2022 showed a large prostate with an lateral lobe hypertrophy, but no suspicious lesions, and cytology was negative. ? ?PSA continues to rise and was 23 in February 2023, and he ultimately underwent a prostate MRI on 12/31/2021 that showed a 66 g  prostate with a small PI-RADS 4 lesion in the right anterior peripheral zone and PI-RADS 3 lesion in the left posterior lateral zone.  He underwent a fusion biopsy with Dr. Cardell Peach at [facility] u[facility], and all cores showed only benign tissue. ? ?We discussed possibility of a false negative biopsy, but 2 completely negative biopsies is certainly reassuring.  We discussed the importance of continuing to monitor the PSA, I recommended follow-up in 6 months with a PSA prior. ? ?Assessment and Plan: ? ? ?Follow Up: ?RTC 6 months with PSA prior ?  ?I discussed the assessment and treatment plan with the patient. The patient was provided an opportunity to ask questions and all were answered. The patient agreed with the plan and demonstrated an understanding of the instructions. ?  ?The patient was advised to call back or seek an in-person evaluation if the symptoms worsen or if the condition fails to improve as anticipated. ? ?I provided 7 minutes of non-face-to-face time during this encounter. ? ? ?[name], MD  ? ?

## 2022-08-21 ENCOUNTER — Other Ambulatory Visit: Payer: Medicare Other

## 2022-08-21 DIAGNOSIS — R972 Elevated prostate specific antigen [PSA]: Secondary | ICD-10-CM

## 2022-08-22 LAB — PSA: Prostate Specific Ag, Serum: 18 ng/mL — ABNORMAL HIGH (ref 0.0–4.0)

## 2022-08-26 ENCOUNTER — Ambulatory Visit: Payer: Medicare Other | Admitting: Urology

## 2022-08-26 ENCOUNTER — Encounter: Payer: Self-pay | Admitting: Urology

## 2022-08-26 VITALS — BP 161/76 | HR 73 | Ht 68.0 in | Wt 175.0 lb

## 2022-08-26 DIAGNOSIS — R972 Elevated prostate specific antigen [PSA]: Secondary | ICD-10-CM

## 2022-08-26 NOTE — Progress Notes (Signed)
   08/26/2022 8:54 AM   Sean Reilly 08/05/1956 [id number]  Reason for visit: Follow up elevated PSA  HPI: 66 year old male with interesting urologic history, underwent a prostate biopsy in January 2022 for an elevated PSA of 16.8 which showed a 74 g prostate with a PSA density of 0.23, and all cores showed only benign tissue.  This was complicated by UTI that did not require admission.  His UTI symptoms did not completely resolve after a 2-week course of Keflex prescribed by the ER, and urine culture 05/21/2021 showed MDR E. Coli(resistant to ampicillin, Cipro, gentamicin, Levaquin, Bactrim )worrisome for prostatitis.  He was then treated with a 3-week course of Keflex which improved his urinary symptoms.  I then saw him in October 2022 when he was having some persistent urinary symptoms despite a negative urinalysis and culture, and with his extensive smoking history we opted for cystoscopy to rule out bladder lesion.  Cystoscopy in November 2022 showed a large prostate with an lateral lobe hypertrophy, but no suspicious lesions, and cytology was negative.   PSA continues to rise and was 23 in February 2023, and he ultimately underwent a prostate MRI on 12/31/2021 that showed a 66 g prostate with a small PI-RADS 4 lesion in the right anterior peripheral zone and PI-RADS 3 lesion in the left posterior lateral zone.  He underwent a fusion biopsy with Dr. Abner Greenspan at [facility] urology, and all cores showed only benign tissue.    He denies any problems over the last 6 months and really has been doing well.  He denies any urinary symptoms or gross hematuria.PSA decreased/stable at 18 from 23 last year, and 94 in December 2021.   We discussed possibility of a false negative biopsy, but 2 completely negative biopsies is certainly reassuring.  We discussed the importance of continuing to monitor the PSA, I recommended follow-up in 1 year with PSA prior, DRE.  RTC 1 year PSA prior, DRE  Billey Co,  MD  [place] [address] [place], [place] [address] [phone]

## 2022-08-26 NOTE — Patient Instructions (Signed)
Prostate Cancer Screening  Prostate cancer screening is testing that is done to check for the presence of prostate cancer in men. The prostate gland is a walnut-sized gland that is located below the bladder and in front of the rectum in males. The function of the prostate is to add fluid to semen during ejaculation. Prostate cancer is one of the most common types of cancer in men. Who should have prostate cancer screening? Screening recommendations vary based on age and other risk factors, as well as between the professional organizations who make the recommendations. In general, screening is recommended if: You are age 50 to 70 and have an average risk for prostate cancer. You should talk with your health care provider about your need for screening and how often screening should be done. Because most prostate cancers are slow growing and will not cause death, screening in this age group is generally reserved for men who have a 10- to 15-year life expectancy. You are younger than age 50, and you have these risk factors: Having a father, brother, or uncle who has been diagnosed with prostate cancer. The risk is higher if your family member's cancer occurred at an early age or if you have multiple family members with prostate cancer at an early age. Being a male who is Black or is of Caribbean or sub-Saharan African descent. In general, screening is not recommended if: You are younger than age 40. You are between the ages of 40 and 49 and you have no risk factors. You are 70 years of age or older. At this age, the risks that screening can cause are greater than the benefits that it may provide. If you are at high risk for prostate cancer, your health care provider may recommend that you have screenings more often or that you start screening at a younger age. How is screening for prostate cancer done? The recommended prostate cancer screening test is a blood test called the prostate-specific antigen  (PSA) test. PSA is a protein that is made in the prostate. As you age, your prostate naturally produces more PSA. Abnormally high PSA levels may be caused by: Prostate cancer. An enlarged prostate that is not caused by cancer (benign prostatic hyperplasia, or BPH). This condition is very common in older men. A prostate gland infection (prostatitis) or urinary tract infection. Certain medicines such as male hormones (like testosterone) or other medicines that raise testosterone levels. A rectal exam may be done as part of prostate cancer screening to help provide information about the size of your prostate gland. When a rectal exam is performed, it should be done after the PSA level is drawn to avoid any effect on the results. Depending on the PSA results, you may need more tests, such as: A physical exam to check the size of your prostate gland, if not done as part of screening. Blood and imaging tests. A procedure to remove tissue samples from your prostate gland for testing (biopsy). This is the only way to know for certain if you have prostate cancer. What are the benefits of prostate cancer screening? Screening can help to identify cancer at an early stage, before symptoms start and when the cancer can be treated more easily. There is a small chance that screening may lower your risk of dying from prostate cancer. The chance is small because prostate cancer is a slow-growing cancer, and most men with prostate cancer die from a different cause. What are the risks of prostate cancer screening? The   main risk of prostate cancer screening is diagnosing and treating prostate cancer that would never have caused any symptoms or problems. This is called overdiagnosisand overtreatment. PSA screening cannot tell you if your PSA is high due to cancer or a different cause. A prostate biopsy is the only procedure to diagnose prostate cancer. Even the results of a biopsy may not tell you if your cancer needs to  be treated. Slow-growing prostate cancer may not need any treatment other than monitoring, so diagnosing and treating it may cause unnecessary stress or other side effects. Questions to ask your health care provider When should I start prostate cancer screening? What is my risk for prostate cancer? How often do I need screening? What type of screening tests do I need? How do I get my test results? What do my results mean? Do I need treatment? Where to find more information The American Cancer Society: www.cancer.org American Urological Association: www.auanet.org Contact a health care provider if: You have difficulty urinating. You have pain when you urinate or ejaculate. You have blood in your urine or semen. You have pain in your back or in the area of your prostate. Summary Prostate cancer is a common type of cancer in men. The prostate gland is located below the bladder and in front of the rectum. This gland adds fluid to semen during ejaculation. Prostate cancer screening may identify cancer at an early stage, when the cancer can be treated more easily and is less likely to have spread to other areas of the body. The prostate-specific antigen (PSA) test is the recommended screening test for prostate cancer, but it has associated risks. Discuss the risks and benefits of prostate cancer screening with your health care provider. If you are age 70 or older, the risks that screening can cause are greater than the benefits that it may provide. This information is not intended to replace advice given to you by your health care provider. Make sure you discuss any questions you have with your health care provider. Document Revised: 04/14/2021 Document Reviewed: 04/14/2021 Elsevier Patient Education  2023 Elsevier Inc.  Prostate-Specific Antigen Test Why am I having this test? The prostate-specific antigen (PSA) test is a screening test for prostate cancer. It can identify early signs of  prostate cancer, which may allow for early detection and more effective treatment. Your health care provider may recommend that you have a PSA test starting at age 50 or that you have one earlier if you are at higher risk for prostate cancer. You may also have a PSA test: To monitor treatment of prostate cancer. To check whether prostate cancer has returned after treatment. What is being tested? This test measures the amount of PSA in your blood. PSA is a protein that is made in the prostate. The prostate naturally produces more PSA as you age, but very high levels may be a sign of a medical condition. What kind of sample is taken?  A blood sample is required for this test. It is usually collected by inserting a needle into a blood vessel but can also be collected by sticking a finger with a small needle. Blood for this test should be drawn before having an exam of the prostate that involves digital rectal examination to avoid affecting the results. How do I prepare for this test? Do not ejaculate starting 24 hours before your test, or as long as told by your health care provider, as this can cause an elevation in PSA. Do not undergo   any procedures that require manipulation of the prostate, such as biopsy or surgery, for 6 weeks before the test is done as this can cause an elevation in PSA. Tell a health care provider about: Any signs you may have of other conditions that can affect PSA levels, such as: An enlarged prostate that is not caused by cancer (benign prostatic hyperplasia, or BPH). This condition is very common in older men. A prostate or urinary tract infection. Any allergies you have. All medicines you are taking, including vitamins, herbs, eye drops, creams, and over-the-counter medicines. This also includes: Medicines to assist with hair growth, such as finasteride. Any recent exposure to a medicine called diethylstilbestrol (DES). Medicines such as male hormones (like testosterone)  or other medicines that raise testosterone levels. Any bleeding problems you have. Any recent procedures you have had, especially any procedures involving the prostate or rectum. Any medical conditions you have. How are the results reported? Your test results will be reported as a value that indicates how much PSA is in your blood. This will be given as nanograms of PSA per milliliter of blood (ng/mL). Your health care provider will compare your results to normal ranges that were established after testing a large group of people (reference ranges). Reference ranges may vary among labs and hospitals. PSA levels vary from person to person and generally increase with age. Because of this variation, there is no single PSA value that is considered normal for everyone. Instead, PSA reference ranges are used to describe whether your PSA levels are considered low or high (elevated). Common reference ranges are: Low: 0-2.5 ng/mL. Slightly to moderately elevated: 2.6-10.0 ng/mL. Moderately elevated: 10.0-19.9 ng/mL. Significantly elevated: 20 ng/mL or greater. What do the results mean? A test result that is higher than 4 ng/mL may mean that you have prostate cancer. However, a PSA test by itself is not enough to diagnose prostate cancer. High PSA levels may also be caused by the natural aging process, prostate infection (prostatitis), or BPH. PSA screening cannot tell you if your PSA is high due to cancer or a different cause. A prostate biopsy is the only way to diagnose prostate cancer. A risk of having the PSA test is diagnosing and treating prostate cancer that would never have caused any symptoms or problems (overdiagnosis and overtreatment). Talk with your health care provider about what your results mean. In some cases, your health care provider may do more testing to confirm the results. Questions to ask your health care provider Ask your health care provider, or the department that is doing the  test: When will my results be ready? How will I get my results? What are my treatment options? What other tests do I need? What are my next steps? Summary The prostate-specific antigen (PSA) test is a screening test for prostate cancer. Your health care provider may recommend that you have a PSA test starting at age 50 or that you have one earlier if you are at higher risk for prostate cancer. A test result that is higher than 4 ng/mL may mean that you have prostate cancer. However, elevated levels can be caused by a number of conditions other than prostate cancer. Talk with your health care provider about what your results mean. This information is not intended to replace advice given to you by your health care provider. Make sure you discuss any questions you have with your health care provider. Document Revised: 02/26/2021 Document Reviewed: 02/26/2021 Elsevier Patient Education  2023 Elsevier Inc.  

## 2023-04-29 IMAGING — MR MR PROSTATE WO/W CM
56 series · 56 of 56 positions shown · IV contrast (8ml Gadavist)
Comparison: None.

CLINICAL DATA: Elevated PSA level of 23.0 on 12/12/2021. Biopsy
11/07/2020 was negative for malignancy.

EXAM:
MR PROSTATE WITHOUT AND WITH CONTRAST
TECHNIQUE: Multiplanar multisequence MRI images were obtained of the pelvis
centered about the prostate. Pre and post contrast images were
obtained.
CONTRAST:  8mL GADAVIST GADOBUTROL 1 MMOL/ML IV SOLN

[Series 3: ax in&out whole · axial · 6.0mm · 0.74mm/px · 1 of 35 slices shown (1 of 2)]
[im 1/35]
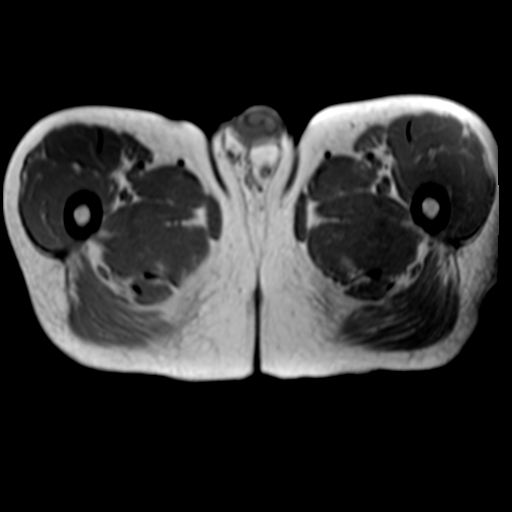

[Series 3: ax in&out whole · axial · 6.0mm · 0.74mm/px · 1 of 35 slices shown (2 of 2)]
[im 1/35]
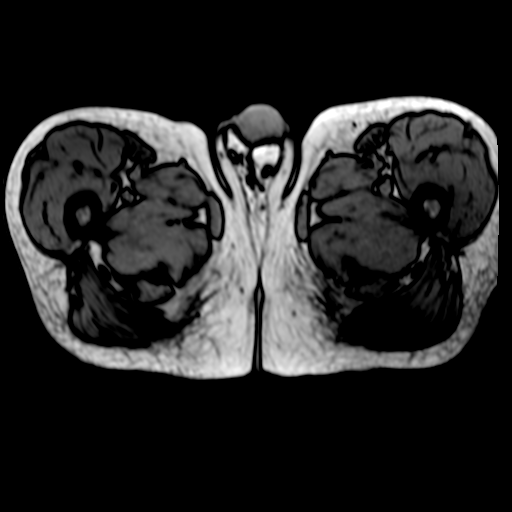

[Series 4: T2 · coronal · 3.0mm · 0.70mm/px · 1 of 35 slices shown (1 of 3)]
[im 1/35]
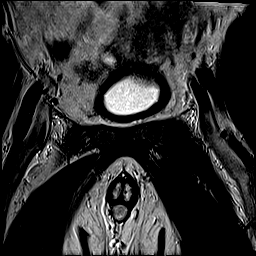

[Series 5: T2 · axial · 3.0mm · 0.56mm/px · 1 of 30 slices shown (2 of 3)]
[im 1/30]
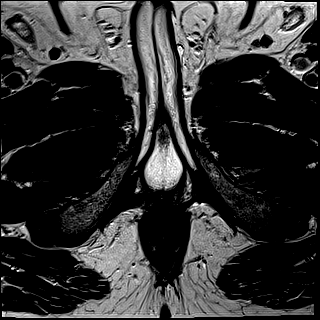

[Series 6: DWI · axial · 3.0mm · 0.86mm/px · 1 of 81 slices shown (1 of 3)]
[im 1/81]
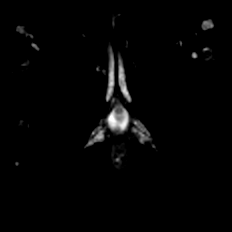

[Series 7: DWI · axial · 3.0mm · 0.86mm/px · 1 of 27 slices shown (2 of 3)]
[im 1/27]
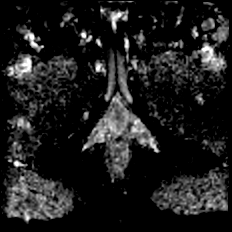

[Series 8: DWI · axial · 3.0mm · 0.86mm/px · 1 of 27 slices shown (3 of 3)]
[im 1/27]
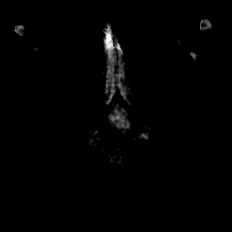

[Series 9: T2 · axial · 1.0mm · 1.04mm/px · 1 of 88 slices shown (3 of 3)]
[im 1/88]
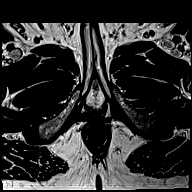

[Series 10: T1 · axial · 3.0mm · 1.15mm/px · 1 of 30 slices shown (1 of 48)]
[im 1/30]
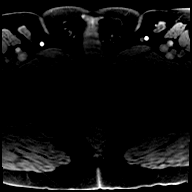

[Series 11: T1 · axial · 3.0mm · 1.15mm/px · 1 of 30 slices shown (2 of 48)]
[im 1/30]
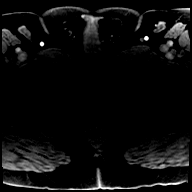

[Series 12: T1 · axial · 3.0mm · 1.15mm/px · 1 of 30 slices shown (3 of 48)]
[im 1/30]
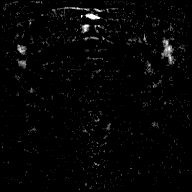

[Series 13: T1 · axial · 3.0mm · 1.15mm/px · 1 of 30 slices shown (4 of 48)]
[im 1/30]
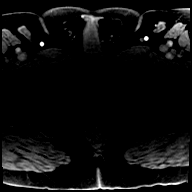

[Series 14: T1 · axial · 3.0mm · 1.15mm/px · 1 of 30 slices shown (5 of 48)]
[im 1/30]
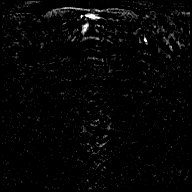

[Series 15: T1 · axial · 3.0mm · 1.15mm/px · 1 of 30 slices shown (6 of 48)]
[im 1/30]
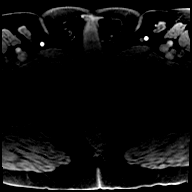

[Series 16: T1 · axial · 3.0mm · 1.15mm/px · 1 of 30 slices shown (7 of 48)]
[im 1/30]
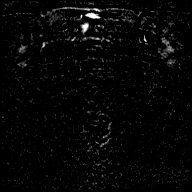

[Series 17: T1 · axial · 3.0mm · 1.15mm/px · 1 of 30 slices shown (8 of 48)]
[im 1/30]
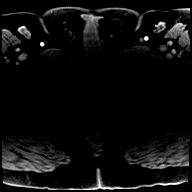

[Series 18: T1 · axial · 3.0mm · 1.15mm/px · 1 of 30 slices shown (9 of 48)]
[im 1/30]
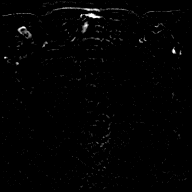

[Series 19: T1 · axial · 3.0mm · 1.15mm/px · 1 of 30 slices shown (10 of 48)]
[im 1/30]
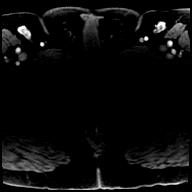

[Series 20: T1 · axial · 3.0mm · 1.15mm/px · 1 of 30 slices shown (11 of 48)]
[im 1/30]
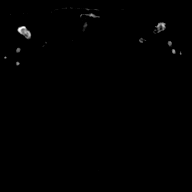

[Series 21: T1 · axial · 3.0mm · 1.15mm/px · 1 of 30 slices shown (12 of 48)]
[im 1/30]
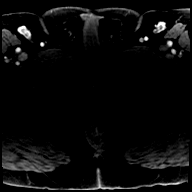

[Series 22: T1 · axial · 3.0mm · 1.15mm/px · 1 of 30 slices shown (13 of 48)]
[im 1/30]
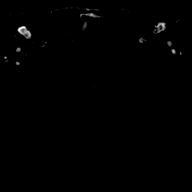

[Series 23: T1 · axial · 3.0mm · 1.15mm/px · 1 of 30 slices shown (14 of 48)]
[im 1/30]
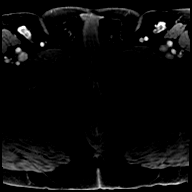

[Series 24: T1 · axial · 3.0mm · 1.15mm/px · 1 of 30 slices shown (15 of 48)]
[im 1/30]
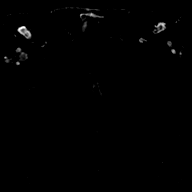

[Series 25: T1 · axial · 3.0mm · 1.15mm/px · 1 of 30 slices shown (16 of 48)]
[im 1/30]
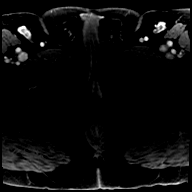

[Series 26: T1 · axial · 3.0mm · 1.15mm/px · 1 of 30 slices shown (17 of 48)]
[im 1/30]
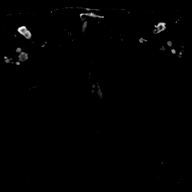

[Series 27: T1 · axial · 3.0mm · 1.15mm/px · 1 of 30 slices shown (18 of 48)]
[im 1/30]
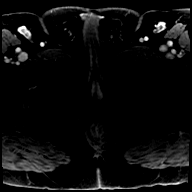

[Series 28: T1 · axial · 3.0mm · 1.15mm/px · 1 of 30 slices shown (19 of 48)]
[im 1/30]
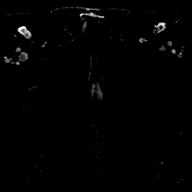

[Series 29: T1 · axial · 3.0mm · 1.15mm/px · 1 of 30 slices shown (20 of 48)]
[im 1/30]
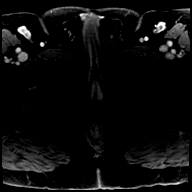

[Series 30: T1 · axial · 3.0mm · 1.15mm/px · 1 of 30 slices shown (21 of 48)]
[im 1/30]
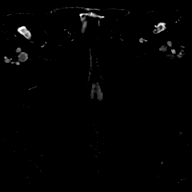

[Series 31: T1 · axial · 3.0mm · 1.15mm/px · 1 of 30 slices shown (22 of 48)]
[im 1/30]
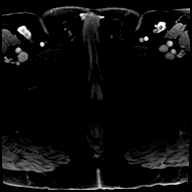

[Series 32: T1 · axial · 3.0mm · 1.15mm/px · 1 of 30 slices shown (23 of 48)]
[im 1/30]
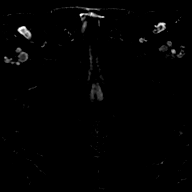

[Series 33: T1 · axial · 3.0mm · 1.15mm/px · 1 of 30 slices shown (24 of 48)]
[im 1/30]
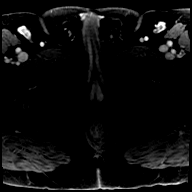

[Series 34: T1 · axial · 3.0mm · 1.15mm/px · 1 of 30 slices shown (25 of 48)]
[im 1/30]
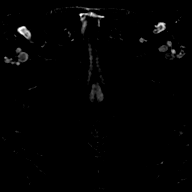

[Series 35: T1 · axial · 3.0mm · 1.15mm/px · 1 of 30 slices shown (26 of 48)]
[im 1/30]
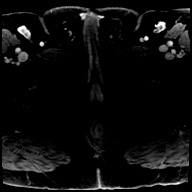

[Series 36: T1 · axial · 3.0mm · 1.15mm/px · 1 of 30 slices shown (27 of 48)]
[im 1/30]
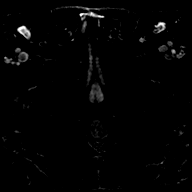

[Series 37: T1 · axial · 3.0mm · 1.15mm/px · 1 of 30 slices shown (28 of 48)]
[im 1/30]
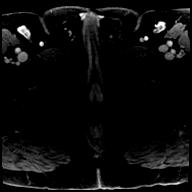

[Series 38: T1 · axial · 3.0mm · 1.15mm/px · 1 of 30 slices shown (29 of 48)]
[im 1/30]
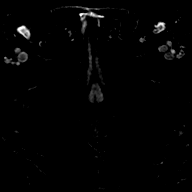

[Series 39: T1 · axial · 3.0mm · 1.15mm/px · 1 of 30 slices shown (30 of 48)]
[im 1/30]
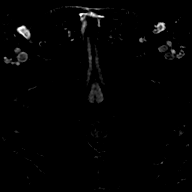

[Series 40: T1 · axial · 3.0mm · 1.15mm/px · 1 of 30 slices shown (31 of 48)]
[im 1/30]
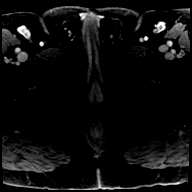

[Series 41: T1 · axial · 3.0mm · 1.15mm/px · 1 of 30 slices shown (32 of 48)]
[im 1/30]
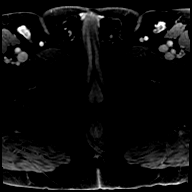

[Series 42: T1 · axial · 3.0mm · 1.15mm/px · 1 of 30 slices shown (33 of 48)]
[im 1/30]
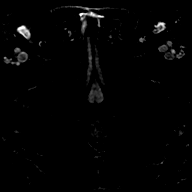

[Series 43: T1 · axial · 3.0mm · 1.15mm/px · 1 of 30 slices shown (34 of 48)]
[im 1/30]
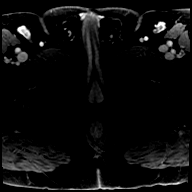

[Series 44: T1 · axial · 3.0mm · 1.15mm/px · 1 of 30 slices shown (35 of 48)]
[im 1/30]
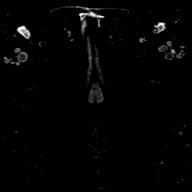

[Series 45: T1 · axial · 3.0mm · 1.15mm/px · 1 of 30 slices shown (36 of 48)]
[im 1/30]
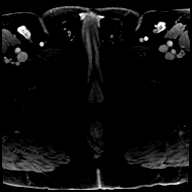

[Series 46: T1 · axial · 3.0mm · 1.15mm/px · 1 of 30 slices shown (37 of 48)]
[im 1/30]
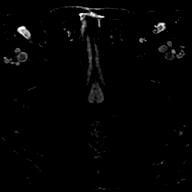

[Series 47: T1 · axial · 3.0mm · 1.15mm/px · 1 of 30 slices shown (38 of 48)]
[im 1/30]
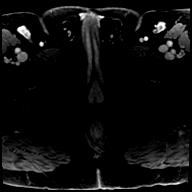

[Series 48: T1 · axial · 3.0mm · 1.15mm/px · 1 of 30 slices shown (39 of 48)]
[im 1/30]
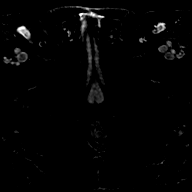

[Series 49: T1 · axial · 3.0mm · 1.15mm/px · 1 of 30 slices shown (40 of 48)]
[im 1/30]
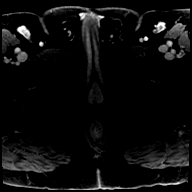

[Series 50: T1 · axial · 3.0mm · 1.15mm/px · 1 of 30 slices shown (41 of 48)]
[im 1/30]
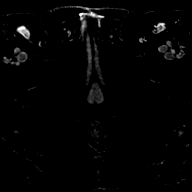

[Series 51: T1 · axial · 3.0mm · 1.15mm/px · 1 of 30 slices shown (42 of 48)]
[im 1/30]
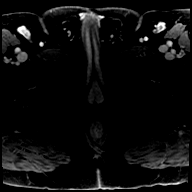

[Series 52: T1 · axial · 3.0mm · 1.15mm/px · 1 of 30 slices shown (43 of 48)]
[im 1/30]
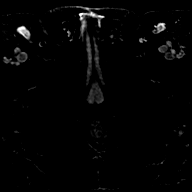

[Series 53: T1 · axial · 3.0mm · 1.15mm/px · 1 of 30 slices shown (44 of 48)]
[im 1/30]
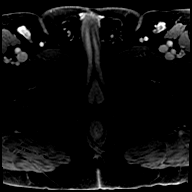

[Series 54: T1 · axial · 3.0mm · 1.15mm/px · 1 of 30 slices shown (45 of 48)]
[im 1/30]
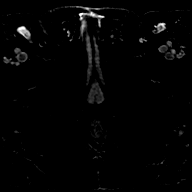

[Series 55: T1 · axial · 3.0mm · 1.15mm/px · 1 of 30 slices shown (46 of 48)]
[im 1/30]
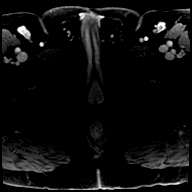

[Series 56: T1 · axial · 3.0mm · 1.15mm/px · 1 of 30 slices shown (47 of 48)]
[im 1/30]
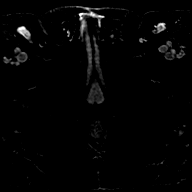

[Series 57: T1 · axial · 3.0mm · 1.15mm/px · 1 of 30 slices shown (48 of 48)]
[im 1/30]
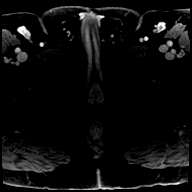

[56 of 56 positions shown; findings below may reference images not displayed]

FINDINGS: Prostate:

Region of interest # 1: PI-RADS category 4 lesion of the right
anterior peripheral zone in the mid gland, with focally reduced T2
signal (image 49, series 9) and focal early enhancement (image 17,
series 2). This measures 0.34 cc (1.4 by 0.6 by 0.7 cm).

Region of interest # 2: PI-RADS category 3 lesion of the left
posterolateral and left anterior peripheral zone at the apex with
focally reduced T2 signal (image 58, series 9) without substantial
focal early enhancement or restricted diffusion. This measures
cc (0.9 by 0.5 by 0.7 cm).

There is early enhancement in the left peripheral zone in a
symmetric manner on image 15 series 22, but without substantial low
T2 signal in this region to further indicate an underlying lesion.

Encapsulated nodularity in the transition zone compatible with
benign prostatic hypertrophy.

Volume: 3D volumetric analysis: Prostate volume 66.51 cc (5.6 by
by 5.7 cm).

Transcapsular spread:  Absent

Seminal vesicle involvement: Absent

Neurovascular bundle involvement: Absent

Pelvic adenopathy: Absent

Bone metastasis: Absent

Other findings: No supplemental non-categorized findings.
IMPRESSION: 1. PI-RADS category 4 lesion of the right anterior peripheral zone
in the mid gland, and PI-RADS category 3 lesion of the left
peripheral zone at the apex. Targeting data sent to [facility].
2. Encapsulated nodularity in the transition zone compatible with
benign prostatic hypertrophy. Prostatomegaly.

## 2023-08-26 ENCOUNTER — Encounter: Payer: Self-pay | Admitting: Urology

## 2023-08-26 ENCOUNTER — Ambulatory Visit: Payer: Medicare Other | Admitting: Urology

## 2023-08-26 VITALS — BP 192/82 | HR 87 | Ht 68.0 in | Wt 190.0 lb

## 2023-08-26 DIAGNOSIS — R399 Unspecified symptoms and signs involving the genitourinary system: Secondary | ICD-10-CM | POA: Diagnosis not present

## 2023-08-26 DIAGNOSIS — R972 Elevated prostate specific antigen [PSA]: Secondary | ICD-10-CM | POA: Diagnosis not present

## 2023-08-26 NOTE — Progress Notes (Signed)
08/26/2023 3:38 PM   Sean Reilly 07/01/1956 [id number]  Reason for visit: Follow up elevated PSA, LUTS  HPI: 67 year-old male with interesting urologic history, underwent a prostate biopsy in January 2022 for an elevated PSA of 16.8 which showed a 74 g prostate with a PSA density of 0.23, and all cores showed only benign tissue.  This was complicated by UTI that did not require admission.  His UTI symptoms did not completely resolve after a 2-week course of Keflex prescribed by the ER, and urine culture 05/21/2021 showed MDR E. Coli(resistant to ampicillin, Cipro, gentamicin, Levaquin, Bactrim )worrisome for prostatitis.  He was then treated with a 3-week course of Keflex which improved his urinary symptoms.    I then saw him in October 2022 when he was having some persistent urinary symptoms despite a negative urinalysis and culture, and with his extensive smoking history we opted for cystoscopy to rule out bladder lesion.  Cystoscopy in November 2022 showed a large prostate with lateral lobe hypertrophy, but no suspicious lesions, and cytology was negative.  He denies any significant urinary symptoms today aside from some weak urinary stream in the morning, not bothersome enough to consider Flomax.   PSA continued to increase up to 23 in February 2023 and he ultimately underwent a prostate MRI on 12/31/2021 that showed a 66 g prostate with a small PI-RADS 4 lesion in the right anterior peripheral zone and PI-RADS 3 lesion in the left posterior lateral zone.  He underwent a fusion biopsy with Dr. Cardell Peach at [facility] urology, and all cores showed only benign tissue.    Most recent PSA decreased to 18, which is lower than it has been in the last 2 years. We discussed possibility of a false negative biopsy, but 2 completely negative biopsies is certainly reassuring.  We discussed the importance of continuing to monitor the PSA, I recommended follow-up in 1 year with PSA prior, DRE.  RTC 1 year PSA  prior, PVR  [name], MD  [facility] [address] [place], [place] [address] [phone]

## 2024-03-12 ENCOUNTER — Inpatient Hospital Stay (HOSPITAL_COMMUNITY)
Admission: EM | Admit: 2024-03-12 | Discharge: 2024-03-14 | DRG: 064 | Disposition: A | Discharge status: Home / Self Care | Attending: Family Medicine | Admitting: Family Medicine

## 2024-03-12 ENCOUNTER — Emergency Department (HOSPITAL_COMMUNITY)

## 2024-03-12 ENCOUNTER — Encounter (HOSPITAL_COMMUNITY): Payer: Self-pay

## 2024-03-12 ENCOUNTER — Other Ambulatory Visit: Payer: Self-pay

## 2024-03-12 DIAGNOSIS — I129 Hypertensive chronic kidney disease with stage 1 through stage 4 chronic kidney disease, or unspecified chronic kidney disease: Secondary | ICD-10-CM | POA: Diagnosis present

## 2024-03-12 DIAGNOSIS — Z91148 Patient's other noncompliance with medication regimen for other reason: Secondary | ICD-10-CM

## 2024-03-12 DIAGNOSIS — R29704 NIHSS score 4: Secondary | ICD-10-CM | POA: Diagnosis not present

## 2024-03-12 DIAGNOSIS — I639 Cerebral infarction, unspecified: Secondary | ICD-10-CM | POA: Diagnosis not present

## 2024-03-12 DIAGNOSIS — I160 Hypertensive urgency: Secondary | ICD-10-CM | POA: Diagnosis present

## 2024-03-12 DIAGNOSIS — I609 Nontraumatic subarachnoid hemorrhage, unspecified: Secondary | ICD-10-CM | POA: Diagnosis not present

## 2024-03-12 DIAGNOSIS — F17210 Nicotine dependence, cigarettes, uncomplicated: Secondary | ICD-10-CM | POA: Diagnosis present

## 2024-03-12 DIAGNOSIS — R270 Ataxia, unspecified: Secondary | ICD-10-CM | POA: Diagnosis present

## 2024-03-12 DIAGNOSIS — I5A Non-ischemic myocardial injury (non-traumatic): Secondary | ICD-10-CM | POA: Diagnosis present

## 2024-03-12 DIAGNOSIS — I169 Hypertensive crisis, unspecified: Secondary | ICD-10-CM | POA: Diagnosis not present

## 2024-03-12 DIAGNOSIS — I161 Hypertensive emergency: Secondary | ICD-10-CM | POA: Diagnosis present

## 2024-03-12 DIAGNOSIS — R200 Anesthesia of skin: Secondary | ICD-10-CM | POA: Diagnosis present

## 2024-03-12 DIAGNOSIS — E785 Hyperlipidemia, unspecified: Secondary | ICD-10-CM | POA: Diagnosis present

## 2024-03-12 DIAGNOSIS — I6389 Other cerebral infarction: Secondary | ICD-10-CM | POA: Diagnosis not present

## 2024-03-12 DIAGNOSIS — R471 Dysarthria and anarthria: Secondary | ICD-10-CM | POA: Diagnosis present

## 2024-03-12 DIAGNOSIS — I10 Essential (primary) hypertension: Secondary | ICD-10-CM

## 2024-03-12 DIAGNOSIS — N1831 Chronic kidney disease, stage 3a: Secondary | ICD-10-CM | POA: Diagnosis present

## 2024-03-12 DIAGNOSIS — I739 Peripheral vascular disease, unspecified: Secondary | ICD-10-CM

## 2024-03-12 LAB — CBC
HCT: 48.7 % (ref 39.0–52.0)
Hemoglobin: 16.4 g/dL (ref 13.0–17.0)
MCH: 28.5 pg (ref 26.0–34.0)
MCHC: 33.7 g/dL (ref 30.0–36.0)
MCV: 84.7 fL (ref 80.0–100.0)
Platelets: 348 10*3/uL (ref 150–400)
RBC: 5.75 MIL/uL (ref 4.22–5.81)
RDW: 13.2 % (ref 11.5–15.5)
WBC: 10 10*3/uL (ref 4.0–10.5)
nRBC: 0 % (ref 0.0–0.2)

## 2024-03-12 LAB — DIFFERENTIAL
Abs Immature Granulocytes: 0.05 10*3/uL (ref 0.00–0.07)
Basophils Absolute: 0.1 10*3/uL (ref 0.0–0.1)
Basophils Relative: 1 %
Eosinophils Absolute: 0.3 10*3/uL (ref 0.0–0.5)
Eosinophils Relative: 3 %
Immature Granulocytes: 1 %
Lymphocytes Relative: 18 %
Lymphs Abs: 1.8 10*3/uL (ref 0.7–4.0)
Monocytes Absolute: 0.9 10*3/uL (ref 0.1–1.0)
Monocytes Relative: 9 %
Neutro Abs: 6.9 10*3/uL (ref 1.7–7.7)
Neutrophils Relative %: 68 %

## 2024-03-12 LAB — TROPONIN I (HIGH SENSITIVITY)
Troponin I (High Sensitivity): 32 ng/L — ABNORMAL HIGH (ref <18)
Troponin I (High Sensitivity): 50 ng/L — ABNORMAL HIGH (ref <18)

## 2024-03-12 LAB — RAPID URINE DRUG SCREEN, HOSP PERFORMED
Amphetamines: NOT DETECTED
Barbiturates: NOT DETECTED
Benzodiazepines: NOT DETECTED
Cocaine: NOT DETECTED
Opiates: NOT DETECTED
Tetrahydrocannabinol: NOT DETECTED

## 2024-03-12 LAB — I-STAT CHEM 8, ED
BUN: 19 mg/dL (ref 8–23)
Calcium, Ion: 1.13 mmol/L — ABNORMAL LOW (ref 1.15–1.40)
Chloride: 107 mmol/L (ref 98–111)
Creatinine, Ser: 1.5 mg/dL — ABNORMAL HIGH (ref 0.61–1.24)
Glucose, Bld: 91 mg/dL (ref 70–99)
HCT: 47 % (ref 39.0–52.0)
Hemoglobin: 16 g/dL (ref 13.0–17.0)
Potassium: 3.9 mmol/L (ref 3.5–5.1)
Sodium: 140 mmol/L (ref 135–145)
TCO2: 23 mmol/L (ref 22–32)

## 2024-03-12 LAB — ETHANOL: Alcohol, Ethyl (B): <15 mg/dL (ref <15)

## 2024-03-12 LAB — CBG MONITORING, ED: Glucose-Capillary: 96 mg/dL (ref 70–99)

## 2024-03-12 MED ORDER — LABETALOL HCL 5 MG/ML IV SOLN
10.0000 mg | INTRAVENOUS | Status: DC | PRN
  Administered 2024-03-13 (×3): 10 mg via INTRAVENOUS
  Filled 2024-03-12 (×3): qty 4

## 2024-03-12 MED ORDER — ACETAMINOPHEN 500 MG PO TABS: 1000.0000 mg | ORAL_TABLET | Freq: Once | ORAL | Status: DC

## 2024-03-12 MED ORDER — LABETALOL HCL 5 MG/ML IV SOLN
5.0000 mg | Freq: Once | INTRAVENOUS | Status: AC
  Administered 2024-03-12: 5 mg via INTRAVENOUS
  Filled 2024-03-12: qty 4

## 2024-03-12 MED ORDER — IOHEXOL 350 MG/ML SOLN
75.0000 mL | Freq: Once | INTRAVENOUS | Status: AC | PRN
  Administered 2024-03-12: 75 mL via INTRAVENOUS

## 2024-03-12 NOTE — ED Notes (Signed)
 Pt ambulated to the restroom with assistance. Pt unsteady and reports suddenly feeling "drunk." Pt denies blurry vision or dizziness. Provider notified.

## 2024-03-12 NOTE — ED Provider Notes (Cosign Needed Addendum)
 [place] EMERGENCY DEPARTMENT AT [facility] Provider Note   CSN: [id number] Arrival date & time: 03/12/24  1411     History  Chief Complaint  Patient presents with   Stroke like symptoms    Sean Reilly is a 68 y.o. male.  68 year old male with past medical history significant for hypertension who is noncompliant with his antihypertensive presents today for concern of strokelike symptoms which started yesterday around 5:30 PM.  Endorses numbness to his right side and change in his speech.  States his daughter noticed a speech change today and sent him to the emergency department.  He denies any vision change, balance issues, weakness.  The history is provided by the patient. No language interpreter was used.       Home Medications Prior to Admission medications   Medication Sig Start Date End Date Taking? Authorizing Provider  amLODipine (NORVASC) 5 MG tablet Take 5 mg by mouth daily. 08/18/21   [provider]  atorvastatin (LIPITOR) 10 MG tablet Take 10 mg by mouth at bedtime. 08/18/21   [provider]  lisinopril (ZESTRIL) 20 MG tablet Take 20 mg by mouth daily. 08/18/21   [provider]      Allergies    Patient has no known allergies.    Review of Systems   Review of Systems  Constitutional:  Negative for chills and fever.  Eyes:  Negative for photophobia and visual disturbance.  Respiratory:  Negative for shortness of breath.   Cardiovascular:  Positive for chest pain.  Neurological:  Positive for numbness. Negative for weakness.  All other systems reviewed and are negative.   Physical Exam Updated Vital Signs BP (!) 232/105   Pulse 71   Temp 98.2 F (36.8 C) (Oral)   Resp 20   SpO2 100%  Physical Exam  ED Results / Procedures / Treatments   Labs (all labs ordered are listed, but only abnormal results are displayed) Labs Reviewed  CBC  DIFFERENTIAL  PROTIME-INR  APTT  COMPREHENSIVE METABOLIC PANEL WITH  GFR  ETHANOL  RAPID URINE DRUG SCREEN, HOSP PERFORMED  CBG MONITORING, ED  I-STAT CHEM 8, ED  TROPONIN I (HIGH SENSITIVITY)    EKG EKG Interpretation Date/Time:  Sunday Mar 12 2024 14:18:31 EDT Ventricular Rate:  80 PR Interval:  160 QRS Duration:  141 QT Interval:  402 QTC Calculation: 464 R Axis:   33  Text Interpretation: Sinus rhythm LAE, consider biatrial enlargement Nonspecific intraventricular conduction delay Confirmed by Abner Hoffman [phone]) on 03/12/2024 2:36:13 PM  Radiology No results found.  Procedures .Critical Care  Performed by: Lucina Sabal, PA-C Authorized by: Lucina Sabal, PA-C   Critical care provider statement:    Critical care time (minutes):  50   Critical care was necessary to treat or prevent imminent or life-threatening deterioration of the following conditions: cva, sah.   Critical care was time spent personally by me on the following activities:  Development of treatment plan with patient or surrogate, discussions with consultants, evaluation of patient's response to treatment, examination of patient, ordering and review of laboratory studies, ordering and review of radiographic studies, ordering and performing treatments and interventions, pulse oximetry, re-evaluation of patient's condition and review of old charts   Care discussed with: admitting provider       Medications Ordered in ED Medications  labetalol (NORMODYNE) injection 5 mg (5 mg Intravenous Given 03/12/24 1504)    ED Course/ Medical Decision Making/ A&P  Medical Decision Making Amount and/or Complexity of Data Reviewed Labs: ordered. Radiology: ordered.  Risk OTC drugs. Prescription drug management. Decision regarding hospitalization.   Medical Decision Making / ED Course   This patient presents to the ED for concern of strokelike symptoms, this involves an extensive number of treatment options, and is a complaint that carries with it a high  risk of complications and morbidity.  The differential diagnosis includes CVA, TIA, hypoglycemia, metabolic derangement  MDM: 68 year old male presents today for concern of strokelike symptoms.  Significantly elevated blood pressure.  Noncompliant with his home antihypertensives for the past 6 months.  Admission considered but will reevaluate after imaging and labs.  Labetalol given given blood pressure greater than 230.  MRI, CT angio head and neck with without contrast shows evidence of small subarachnoid hemorrhage.  He denies head trauma or headache.  Also evidence of small infarct of the left thalamus. Discussed with Dr. Alecia Ames of neurology.  Recommends holding off on antiplatelet therapy but otherwise treating him as an acute stroke.  Recommends admission to medicine service.  Recommends repeat scan of the head in the morning.  Hospitalist will evaluate patient for admission.  Notified by patient's nurse that patient when ambulating had sensation of disequilibrium which she described as feeling drunk.  Will order repeat head CT.  Notified hospitalist of this as well. He will wait for the results before admitting. Currently blood pressure is 172/92.  If this gets above 180 according to neurology we can give him IVP labetalol as long as his heart rate can tolerate this every 10 minutes until he gets below 180.   Additional history obtained: -Additional history obtained from patient's daughter -External records from outside source obtained and reviewed including: Chart review including previous notes, labs, imaging, consultation notes   Lab Tests: -I ordered, reviewed, and interpreted labs.   The pertinent results include:   Labs Reviewed  I-STAT CHEM 8, ED - Abnormal; Notable for the following components:      Result Value   Creatinine, Ser 1.50 (*)    Calcium, Ion 1.13 (*)    All other components within normal limits  TROPONIN I (HIGH SENSITIVITY) - Abnormal; Notable for  the following components:   Troponin I (High Sensitivity) 32 (*)    All other components within normal limits  TROPONIN I (HIGH SENSITIVITY) - Abnormal; Notable for the following components:   Troponin I (High Sensitivity) 50 (*)    All other components within normal limits  ETHANOL  RAPID URINE DRUG SCREEN, HOSP PERFORMED  CBC  DIFFERENTIAL  PROTIME-INR  APTT  COMPREHENSIVE METABOLIC PANEL WITH GFR  SEDIMENTATION RATE  C-REACTIVE PROTEIN  CBG MONITORING, ED      EKG  EKG Interpretation Date/Time:  Sunday Mar 12 2024 14:18:31 EDT Ventricular Rate:  80 PR Interval:  160 QRS Duration:  141 QT Interval:  402 QTC Calculation: 464 R Axis:   33  Text Interpretation: Sinus rhythm LAE, consider biatrial enlargement Nonspecific intraventricular conduction delay Confirmed by Abner Hoffman [phone]) on 03/12/2024 2:36:13 PM         Imaging Studies ordered: I ordered imaging studies including CT angio head and neck with without contrast, MRI brain without contrast I independently visualized and interpreted imaging. I agree with the radiologist interpretation   Medicines ordered and prescription drug management: Meds ordered this encounter  Medications   labetalol (NORMODYNE) injection 5 mg   acetaminophen (TYLENOL) tablet 1,000 mg   iohexol (OMNIPAQUE) 350 MG/ML injection 75 mL    -  I have reviewed the patients home medicines and have made adjustments as needed  Critical interventions Stroke workup  Consultations Obtained: I requested consultation with the neurology,  and discussed lab and imaging findings as well as pertinent plan - they recommend: As above   Reevaluation: After the interventions noted above, I reevaluated the patient and found that they have :improved  Co morbidities that complicate the patient evaluation  Past Medical History:  Diagnosis Date   Hypertension       Dispostion: Discussed with hospitalist will evaluate patient for admission after CT  results pending results. Notified oncoming team to follow-up on results of the CT head to ensure there is no emergent findings and patient gets admitted.   Final Clinical Impression(s) / ED Diagnoses Final diagnoses:  Cerebrovascular accident (CVA), unspecified mechanism (HCC)  SAH (subarachnoid hemorrhage) [facility])    Rx / DC Orders ED Discharge Orders     None         Lucina Sabal, PA-C 03/12/24 2109    Lucina Sabal, PA-C 03/12/24 2120    Carin Charleston, MD 03/13/24 2012

## 2024-03-12 NOTE — ED Notes (Signed)
 Wife Arcadio Beams [phone] would like an update asap

## 2024-03-12 NOTE — ED Notes (Signed)
 Daughter Loetta Ringer [phone] would like an update asap

## 2024-03-12 NOTE — ED Triage Notes (Signed)
 PT BIB EMS from home for stroke like symptoms starting at 5:30 PM yesterday, stopped taking BP meds in November, having intermittent chest pain for the past few days, and very hypertensive.  254/144 80 HR 101 CBG  98% RA 18 g Right AC

## 2024-03-13 ENCOUNTER — Inpatient Hospital Stay (HOSPITAL_COMMUNITY)

## 2024-03-13 DIAGNOSIS — I639 Cerebral infarction, unspecified: Secondary | ICD-10-CM | POA: Diagnosis present

## 2024-03-13 DIAGNOSIS — I161 Hypertensive emergency: Secondary | ICD-10-CM | POA: Diagnosis present

## 2024-03-13 DIAGNOSIS — R29704 NIHSS score 4: Secondary | ICD-10-CM | POA: Diagnosis present

## 2024-03-13 DIAGNOSIS — I619 Nontraumatic intracerebral hemorrhage, unspecified: Secondary | ICD-10-CM

## 2024-03-13 DIAGNOSIS — R471 Dysarthria and anarthria: Secondary | ICD-10-CM | POA: Diagnosis present

## 2024-03-13 DIAGNOSIS — R270 Ataxia, unspecified: Secondary | ICD-10-CM | POA: Diagnosis present

## 2024-03-13 DIAGNOSIS — E785 Hyperlipidemia, unspecified: Secondary | ICD-10-CM

## 2024-03-13 DIAGNOSIS — I5A Non-ischemic myocardial injury (non-traumatic): Secondary | ICD-10-CM | POA: Diagnosis present

## 2024-03-13 DIAGNOSIS — Z91148 Patient's other noncompliance with medication regimen for other reason: Secondary | ICD-10-CM | POA: Diagnosis not present

## 2024-03-13 DIAGNOSIS — I609 Nontraumatic subarachnoid hemorrhage, unspecified: Secondary | ICD-10-CM | POA: Diagnosis present

## 2024-03-13 DIAGNOSIS — I6389 Other cerebral infarction: Secondary | ICD-10-CM

## 2024-03-13 DIAGNOSIS — I129 Hypertensive chronic kidney disease with stage 1 through stage 4 chronic kidney disease, or unspecified chronic kidney disease: Secondary | ICD-10-CM | POA: Diagnosis present

## 2024-03-13 DIAGNOSIS — R200 Anesthesia of skin: Secondary | ICD-10-CM | POA: Diagnosis present

## 2024-03-13 DIAGNOSIS — I160 Hypertensive urgency: Secondary | ICD-10-CM

## 2024-03-13 DIAGNOSIS — N1831 Chronic kidney disease, stage 3a: Secondary | ICD-10-CM | POA: Diagnosis present

## 2024-03-13 DIAGNOSIS — F17210 Nicotine dependence, cigarettes, uncomplicated: Secondary | ICD-10-CM | POA: Diagnosis present

## 2024-03-13 DIAGNOSIS — I739 Peripheral vascular disease, unspecified: Secondary | ICD-10-CM | POA: Diagnosis not present

## 2024-03-13 DIAGNOSIS — D72829 Elevated white blood cell count, unspecified: Secondary | ICD-10-CM

## 2024-03-13 DIAGNOSIS — I6381 Other cerebral infarction due to occlusion or stenosis of small artery: Secondary | ICD-10-CM | POA: Diagnosis not present

## 2024-03-13 LAB — CBC
HCT: 48.7 % (ref 39.0–52.0)
Hemoglobin: 16 g/dL (ref 13.0–17.0)
MCH: 28.3 pg (ref 26.0–34.0)
MCHC: 32.9 g/dL (ref 30.0–36.0)
MCV: 86.2 fL (ref 80.0–100.0)
Platelets: 317 10*3/uL (ref 150–400)
RBC: 5.65 MIL/uL (ref 4.22–5.81)
RDW: 13.1 % (ref 11.5–15.5)
WBC: 11 10*3/uL — ABNORMAL HIGH (ref 4.0–10.5)
nRBC: 0 % (ref 0.0–0.2)

## 2024-03-13 LAB — LIPID PANEL
Cholesterol: 246 mg/dL — ABNORMAL HIGH (ref 0–200)
HDL: 33 mg/dL — ABNORMAL LOW (ref >40)
LDL Cholesterol: 177 mg/dL — ABNORMAL HIGH (ref 0–99)
Total CHOL/HDL Ratio: 7.5 ratio
Triglycerides: 179 mg/dL — ABNORMAL HIGH (ref <150)
VLDL: 36 mg/dL (ref 0–40)

## 2024-03-13 LAB — BASIC METABOLIC PANEL WITH GFR
Anion gap: 9 (ref 5–15)
BUN: 15 mg/dL (ref 8–23)
CO2: 22 mmol/L (ref 22–32)
Calcium: 9 mg/dL (ref 8.9–10.3)
Chloride: 108 mmol/L (ref 98–111)
Creatinine, Ser: 1.46 mg/dL — ABNORMAL HIGH (ref 0.61–1.24)
GFR, Estimated: 52 mL/min — ABNORMAL LOW (ref >60)
Glucose, Bld: 101 mg/dL — ABNORMAL HIGH (ref 70–99)
Potassium: 4.3 mmol/L (ref 3.5–5.1)
Sodium: 139 mmol/L (ref 135–145)

## 2024-03-13 LAB — ECHOCARDIOGRAM COMPLETE
Area-P 1/2: 2.54 cm2
Height: 68 in
S' Lateral: 3.3 cm
Weight: 3040.58 [oz_av]

## 2024-03-13 LAB — C-REACTIVE PROTEIN: CRP: 1 mg/dL — ABNORMAL HIGH (ref <1.0)

## 2024-03-13 LAB — SEDIMENTATION RATE: Sed Rate: 18 mm/h — ABNORMAL HIGH (ref 0–16)

## 2024-03-13 LAB — HEMOGLOBIN A1C
Hgb A1c MFr Bld: 5.4 % (ref 4.8–5.6)
Mean Plasma Glucose: 108 mg/dL

## 2024-03-13 LAB — HIV ANTIBODY (ROUTINE TESTING W REFLEX): HIV Screen 4th Generation wRfx: NONREACTIVE

## 2024-03-13 LAB — GLUCOSE, CAPILLARY: Glucose-Capillary: 93 mg/dL (ref 70–99)

## 2024-03-13 LAB — MAGNESIUM: Magnesium: 2.1 mg/dL (ref 1.7–2.4)

## 2024-03-13 LAB — PHOSPHORUS: Phosphorus: 3.1 mg/dL (ref 2.5–4.6)

## 2024-03-13 MED ORDER — AMLODIPINE BESYLATE 5 MG PO TABS
5.0000 mg | ORAL_TABLET | Freq: Every day | ORAL | Status: DC
  Administered 2024-03-13: 5 mg via ORAL
  Filled 2024-03-13: qty 1

## 2024-03-13 MED ORDER — NICOTINE 14 MG/24HR TD PT24
14.0000 mg | MEDICATED_PATCH | Freq: Every day | TRANSDERMAL | Status: DC
  Filled 2024-03-13 (×2): qty 1

## 2024-03-13 MED ORDER — ROSUVASTATIN CALCIUM 20 MG PO TABS: 20.0000 mg | ORAL_TABLET | Freq: Every day | ORAL | Status: DC

## 2024-03-13 MED ORDER — STROKE: EARLY STAGES OF RECOVERY BOOK
Freq: Once | Status: AC
  Filled 2024-03-13: qty 1

## 2024-03-13 MED ORDER — ATORVASTATIN CALCIUM 10 MG PO TABS
10.0000 mg | ORAL_TABLET | Freq: Every day | ORAL | Status: DC
  Administered 2024-03-13: 10 mg via ORAL
  Filled 2024-03-13: qty 1

## 2024-03-13 MED ORDER — NICOTINE POLACRILEX 2 MG MT GUM
2.0000 mg | CHEWING_GUM | OROMUCOSAL | Status: DC | PRN
Start: 2024-03-13 — End: 2024-03-14

## 2024-03-13 MED ORDER — ROSUVASTATIN CALCIUM 20 MG PO TABS
40.0000 mg | ORAL_TABLET | Freq: Every day | ORAL | Status: DC
  Administered 2024-03-13 – 2024-03-14 (×2): 40 mg via ORAL
  Filled 2024-03-13 (×2): qty 2

## 2024-03-13 MED ORDER — LISINOPRIL 20 MG PO TABS
20.0000 mg | ORAL_TABLET | Freq: Every day | ORAL | Status: DC
  Administered 2024-03-13 – 2024-03-14 (×2): 20 mg via ORAL
  Filled 2024-03-13 (×2): qty 1

## 2024-03-13 NOTE — Evaluation (Signed)
 Occupational Therapy Evaluation Patient Details Name: Sean Reilly MRN: [id number] DOB: 1956-03-01 Today's Date: 03/13/2024   History of Present Illness   68 y.o. male presents to [facility] hospital on 03/12/2024 with sudden onset R numbness and dysarthria. CT head reveals acute infarct in L thalamus, subacute infarct in R thalamus. CTA notable for [facility] over the L parietal convexity. PMH includes HTN, HLD, CKD III.     Clinical Impressions Patient admitted for the diagnosis above.  PTA he lives at home with his spouse, and remained Ind with ADL, iADL and mobility.  Currently he presents with decreased insight into deficits and unsteadiness.  Upper extremity coordination deficits are slight, and he is able to use his R arm without a problem, his balance is his primary deficit.  OT will defer to PT for balance and mobility, no pressing OT needs identified.  No post acute OT anticipated.       If plan is discharge home, recommend the following:   Assist for transportation     Functional Status Assessment   Patient has had a recent decline in their functional status and demonstrates the ability to make significant improvements in function in a reasonable and predictable amount of time.     Equipment Recommendations   None recommended by OT     Recommendations for Other Services         Precautions/Restrictions   Precautions Precautions: Fall Recall of Precautions/Restrictions: Intact Restrictions Weight Bearing Restrictions Per Provider Order: No     Mobility Bed Mobility Overal bed mobility: Independent                  Transfers Overall transfer level: Needs assistance Equipment used: None Transfers: Sit to/from Stand, Bed to chair/wheelchair/BSC Sit to Stand: Supervision     Step pivot transfers: Supervision     General transfer comment: off balance, difficulty with transitional movements.      Balance Overall balance assessment: Needs  assistance Sitting-balance support: No upper extremity supported, Feet supported Sitting balance-Leahy Scale: Good     Standing balance support: No upper extremity supported, During functional activity Standing balance-Leahy Scale: Fair                             ADL either performed or assessed with clinical judgement   ADL                       Lower Body Dressing: Supervision/safety   Toilet Transfer: Supervision/safety                   Vision Patient Visual Report: No change from baseline       Perception Perception: Within Functional Limits       Praxis Praxis: WFL       Pertinent Vitals/Pain Pain Assessment Pain Assessment: No/denies pain     Extremity/Trunk Assessment Upper Extremity Assessment Upper Extremity Assessment: RUE deficits/detail RUE Deficits / Details: ROM and strength WFL RUE Sensation: decreased light touch RUE Coordination: decreased fine motor;decreased gross motor   Lower Extremity Assessment Lower Extremity Assessment: Defer to PT evaluation   Cervical / Trunk Assessment Cervical / Trunk Assessment: Normal   Communication Communication Communication: No apparent difficulties   Cognition Arousal: Alert Behavior During Therapy: Impulsive               OT - Cognition Comments: Decreased insight into deficits  Following commands: Intact       Cueing  General Comments   Cueing Techniques: Verbal cues   VSS on RA   Exercises     Shoulder Instructions      Home Living Family/patient expects to be discharged to:: Private residence Living Arrangements: Spouse/significant other;Other relatives Available Help at Discharge: Family;Available 24 hours/day Type of Home: Mobile home Home Access: Stairs to enter Entrance Stairs-Number of Steps: 5 Entrance Stairs-Rails: Can reach both Home Layout: One level     Bathroom Shower/Tub: Chief Strategy Officer:  Standard Bathroom Accessibility: Yes   Home Equipment: Agricultural consultant (2 wheels);Rollator (4 wheels)          Prior Functioning/Environment Prior Level of Function : Independent/Modified Independent;Driving             Mobility Comments: ambulatory without DME      OT Problem List: Impaired balance (sitting and/or standing)   OT Treatment/Interventions:        OT Goals(Current goals can be found in the care plan section)   Acute Rehab OT Goals Patient Stated Goal: Return home OT Goal Formulation: With patient Time For Goal Achievement: 03/17/24 Potential to Achieve Goals: Good   OT Frequency:       Co-evaluation              AM-PAC OT "6 Clicks" Daily Activity     Outcome Measure Help from another person eating meals?: None Help from another person taking care of personal grooming?: None Help from another person toileting, which includes using toliet, bedpan, or urinal?: A Little Help from another person bathing (including washing, rinsing, drying)?: A Little Help from another person to put on and taking off regular upper body clothing?: None Help from another person to put on and taking off regular lower body clothing?: A Little 6 Click Score: 21   End of Session Equipment Utilized During Treatment: Gait belt Nurse Communication: Mobility status  Activity Tolerance: Patient tolerated treatment well Patient left: in chair;with call bell/phone within reach  OT Visit Diagnosis: Unsteadiness on feet (R26.81)                Time: 1610-9604 OT Time Calculation (min): 22 min Charges:  OT General Charges $OT Visit: 1 Visit OT Evaluation $OT Eval Moderate Complexity: 1 Mod  03/13/2024  RP, OTR/L  Acute Rehabilitation Services  Office:  [phone]   Benjamen Brand 03/13/2024, 3:31 PM

## 2024-03-13 NOTE — Progress Notes (Signed)
 STROKE TEAM PROGRESS NOTE   SUBJECTIVE (INTERVAL HISTORY) No family is at the bedside.  Overall his condition is rapidly improving.  Patient still has some right face numbness but otherwise symptoms much improved.  BP still on the higher end, educated on home BP monitoring and BP control.   OBJECTIVE Temp:  [98.1 F (36.7 C)-98.8 F (37.1 C)] 98.2 F (36.8 C) (05/12 1640) Pulse Rate:  [55-79] 61 (05/12 1640) Resp:  [14-31] 18 (05/12 1640) BP: (159-211)/(67-120) 183/92 (05/12 1640) SpO2:  [96 %-100 %] 100 % (05/12 1640) Weight:  [86.2 kg] 86.2 kg (05/11 2148)  Recent Labs  Lab 03/12/24 1424 03/13/24 1645  GLUCAP 96 93   Recent Labs  Lab 03/12/24 1548 03/13/24 0332  NA 140 139  K 3.9 4.3  CL 107 108  CO2  --  22  GLUCOSE 91 101*  BUN 19 15  CREATININE 1.50* 1.46*  CALCIUM  --  9.0  MG  --  2.1  PHOS  --  3.1   No results for input(s): "AST", "ALT", "ALKPHOS", "BILITOT", "PROT", "ALBUMIN" in the last 168 hours. Recent Labs  Lab 03/12/24 1435 03/12/24 1548 03/13/24 0332  WBC 10.0  --  11.0*  NEUTROABS 6.9  --   --   HGB 16.4 16.0 16.0  HCT 48.7 47.0 48.7  MCV 84.7  --  86.2  PLT 348  --  317   No results for input(s): "CKTOTAL", "CKMB", "CKMBINDEX", "TROPONINI" in the last 168 hours. No results for input(s): "LABPROT", "INR" in the last 72 hours. No results for input(s): "COLORURINE", "LABSPEC", "PHURINE", "GLUCOSEU", "HGBUR", "BILIRUBINUR", "KETONESUR", "PROTEINUR", "UROBILINOGEN", "NITRITE", "LEUKOCYTESUR" in the last 72 hours.  Invalid input(s): "APPERANCEUR"     Component Value Date/Time   CHOL 246 (H) 03/13/2024 0332   TRIG 179 (H) 03/13/2024 0332   HDL 33 (L) 03/13/2024 0332   CHOLHDL 7.5 03/13/2024 0332   VLDL 36 03/13/2024 0332   LDLCALC 177 (H) 03/13/2024 0332   No results found for: "HGBA1C"    Component Value Date/Time   LABOPIA NONE DETECTED 03/12/2024 1433   COCAINSCRNUR NONE DETECTED 03/12/2024 1433   LABBENZ NONE DETECTED 03/12/2024  1433   AMPHETMU NONE DETECTED 03/12/2024 1433   THCU NONE DETECTED 03/12/2024 1433   LABBARB NONE DETECTED 03/12/2024 1433    Recent Labs  Lab 03/12/24 1426  ETH <15    I have personally reviewed the radiological images below and agree with the radiology interpretations.  CT HEAD WO CONTRAST ( ) Result Date: 03/13/2024 CLINICAL DATA:  Follow-up subdural hematoma EXAM: CT HEAD WITHOUT CONTRAST TECHNIQUE: Contiguous axial images were obtained from the base of the skull through the vertex without intravenous contrast. RADIATION DOSE REDUCTION: This exam was performed according to the departmental dose-optimization program which includes automated exposure control, adjustment of the mA and/or kV according to patient size and/or use of iterative reconstruction technique. 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 Tubular high-density at the right frontal lobe attributed to developmental venous anomaly, stable. 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 Chronic lacunes at the corpus callosum and right caudate. No acute hemorrhage, hydrocephalus, or mass. Vascular: No hyperdense vessel or unexpected calcification. Skull: Normal. Negative for fracture or focal lesion. Sinuses/Orbits: No acute finding. 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 No interval evolution  at the acute left thalamic lacunar infarct. Electronically Signed   By: Ronnette Coke M.D.   On: 03/13/2024 06:43   CT Head Wo Contrast Result Date: 03/12/2024 CLINICAL DATA:  Follow-up examination for TIA. EXAM: CT HEAD WITHOUT CONTRAST TECHNIQUE: Contiguous axial images were obtained from the base of the skull through the vertex without intravenous contrast. RADIATION DOSE REDUCTION: This exam was performed according to the  departmental dose-optimization program which includes automated exposure control, adjustment of the mA and/or kV according to patient size and/or use of iterative reconstruction technique. COMPARISON:  Comparison made with prior studies from earlier the same day. FINDINGS: Brain: Previously identified small hyperdense focus involving the left parietal convexity again seen, relatively stable from prior (series 2, image 22). Given stability in review of prior MRI, it is postulated that this could reflect a small cavernoma, as this lesion appears to potentially be intra-axial in location on prior MRI. However, a small focus of acute hemorrhage remains difficult to exclude, with continued follow-up warranted. No other new acute intracranial hemorrhage. Acute left thalamic lacunar infarct again noted, better seen on prior MRI. No other acute large vessel territory infarct. No other mass lesion or midline shift. No hydrocephalus or extra-axial fluid collection. Vascular: Contrast material remains on board from prior CTA. Right frontal DVA again noted. Skull: Scalp soft tissues and calvarium demonstrate no new finding. Sinuses/Orbits: Globes orbital soft tissues within normal limits. Paranasal sinuses are clear. No mastoid effusion. Other: None. IMPRESSION: 1. Relatively stable small hyperdense focus involving the left parietal convexity. Upon review of prior MRI, there is question that this could potentially reflect a small cavernoma, although a small focus of acute hemorrhage remains difficult to exclude. Continued short interval follow-up to evaluate for stability versus resolution of this finding would likely be helpful for further evaluation. 2. Acute left thalamic lacunar infarct, better seen on prior MRI. 3. No other new acute intracranial abnormality. Electronically Signed   By: Virgia Griffins M.D.   On: 03/12/2024 23:32   MR BRAIN WO CONTRAST Result Date: 03/12/2024 CLINICAL DATA:  Transient ischemic  attack, stroke-like symptoms starting yesterday evening, stops taking blood pressure medicines in November. EXAM: MRI HEAD WITHOUT CONTRAST TECHNIQUE: Multiplanar, multiecho pulse sequences of the brain and surrounding structures were obtained without intravenous contrast. COMPARISON:  Same day CTA head and neck.  MRI head 07/01/2011. FINDINGS: Brain: 9 mm focus of restricted diffusion within the left thalamus compatible with acute infarct. No significant associated edema or mass effect. There is additional subtle focus of diffusion signal abnormality within the right thalamus without ADC hypointensity which could reflect a small focus of subacute infarct. Focal susceptibility over the left parietal convexity corresponding to focus of subarachnoid hemorrhage noted on same day CTA. Branching curvilinear susceptibility in the anterior inferior right frontal lobe corresponding to developmental venous anomaly. Scattered and confluent T2/FLAIR hyperintensity in the periventricular and subcortical white matter with additional signal abnormality in the pons. Remote lacunar infarcts in the bilateral corona radiata, basal ganglia, and thalami with additional small remote infarcts in the bilateral cerebellum. Remote infarct involving the left genu of the corpus callosum. Normal appearance of midline structures. The basilar cisterns are patent. No extra-axial fluid collections. Ventricles: Normal size and configuration of the ventricles. Vascular: Skull base flow voids are visualized. Skull and upper cervical spine: No focal abnormality. Sinuses/Orbits: Orbits are symmetric. Paranasal sinuses are clear. Other: Mastoid air cells are clear. IMPRESSION: Acute infarct in the left thalamus. Additional punctate subacute infarct involving the  right thalamus. Focal susceptibility over the left parietal convexity corresponding to subarachnoid hemorrhage noted on CTA. Nonspecific white matter signal abnormality which may reflect  moderate chronic microvascular ischemic changes versus sequelae of other vasculopathy. Multiple remote infarcts in the bilateral corona radiata, basal ganglia, and thalami. Additional remote infarct in the left genu of the corpus callosum. Small remote infarcts in the bilateral cerebellum. Right frontal developmental venous anomaly. Findings of acute left flank infarct and left parietal subarachnoid hemorrhage were called by telephone at the time of CTA interpretation on 03/12/2024 at 6:34 pm to provider AMJAD ALI , who verbally acknowledged these results. Electronically Signed   By: Denny Flack M.D.   On: 03/12/2024 19:20   CT ANGIO HEAD NECK W WO CM Result Date: 03/12/2024 CLINICAL DATA:  Transient ischemic attack, EXAM: CT ANGIOGRAPHY HEAD AND NECK WITH AND WITHOUT CONTRAST TECHNIQUE: Multidetector CT imaging of the head and neck was performed using the standard protocol during bolus administration of intravenous contrast. Multiplanar CT image reconstructions and MIPs were obtained to evaluate the vascular anatomy. Carotid stenosis measurements (when applicable) are obtained utilizing NASCET criteria, using the distal internal carotid diameter as the denominator. RADIATION DOSE REDUCTION: This exam was performed according to the departmental dose-optimization program which includes automated exposure control, adjustment of the mA and/or kV according to patient size and/or use of iterative reconstruction technique. CONTRAST:  75mL OMNIPAQUE IOHEXOL 350 MG/ML SOLN COMPARISON:  Same day MRI head. FINDINGS: CT HEAD FINDINGS Brain: Focal subarachnoid hemorrhage over the left parietal convexity involving a single sulcus. No significant mass effect, edema, or midline shift. Additional linear hyperattenuation within the anterior inferior right frontal parenchyma with findings suggestive of developmental venous anomaly on CTA and MRI. The basilar cisterns are patent. Posterior fossa is unremarkable. No evidence of  completed large territory infarct. Nonspecific hypoattenuation in the periventricular and subcortical white matter favored to reflect chronic microvascular ischemic changes. Ventricles: Ventricles are normal in size and configuration. Vascular: No hyperdense vessel. Skull: No acute or aggressive finding. Sinuses/orbits: The visualized paranasal sinuses are clear. Orbits are symmetric. Other: Mastoid air cells are clear. CTA NECK FINDINGS Aortic arch: Standard configuration of the aortic arch. Imaged portion shows no evidence of aneurysm or dissection. Calcified and noncalcified atherosclerotic plaque along the visualized aortic arch extending into the proximal descending thoracic aorta. No significant stenosis of the major arch vessel origins. Pulmonary arteries: As permitted by contrast timing, there are no filling defects in the visualized pulmonary arteries. Subclavian arteries: Patent bilaterally. Noncalcified atherosclerotic plaque involving the origin of the left subclavian artery without significant stenosis. Right carotid system: No evidence of dissection, stenosis (50% or greater), or occlusion. There is mild irregularity of the proximal cervical ICA without significant narrowing. Left carotid system: No evidence of dissection, stenosis (50% or greater), or occlusion. Mild atherosclerosis along the proximal cervical ICA. There is also possible mild circumferential wall thickening along the proximal cervical ICA. Vertebral arteries: Left vertebral artery is dominant. Severe stenosis and possible focal occlusion at the origin of the right vertebral artery. There is reconstitution of the right V1 segment immediately distal to the origin. The left vertebral artery is patent from the origin to the vertebrobasilar confluence. There is irregularity and mild narrowing of the left V4 segment. No evidence of dissection. Skeleton: No acute findings. Degenerative changes in the cervical spine. Disc space narrowing is  most pronounced at C5-6. Edentulous maxilla and mandible. Other neck: The visualized airway is patent. No cervical lymphadenopathy. Upper chest: Emphysema within  the visualized lungs. Review of the MIP images confirms the above findings CTA HEAD FINDINGS ANTERIOR CIRCULATION: The intracranial ICAs are patent bilaterally. Mild atherosclerosis of the bilateral carotid siphons without significant stenosis. There is additional mild stenosis at the posterior vertical portion of the right cavernous ICA without definite atherosclerotic plaque. Mild irregularity of the bilateral cavernous ICAs. No high-grade stenosis, proximal occlusion, aneurysm, or vascular malformation. MCAs: The middle cerebral arteries are patent bilaterally. ACAs: The anterior cerebral arteries are patent bilaterally. Moderate stenosis of the distal A2 segment left ACA. POSTERIOR CIRCULATION: No significant stenosis, proximal occlusion, aneurysm, or vascular malformation. PCAs: The posterior cerebral arteries are patent bilaterally. Pcomm: Not well visualized. SCAs: The superior cerebellar arteries are patent bilaterally. Basilar artery: Patent AICAs: Not well visualized. PICAs: Patent Vertebral arteries: As above. Venous sinuses: As permitted by contrast timing, patent. Anatomic variants: None Review of the MIP images confirms the above findings IMPRESSION: Focal acute subarachnoid hemorrhage over the left parietal convexity involving a single sulcus. No edema, mass effect, or midline shift. Occlusion at the origin of the right vertebral artery with immediate reconstitution of the proximal right V1 segment. This may be due to noncalcified atherosclerotic plaque at the vessel origin. However, there are multiple areas of mild vascular irregularity in the head and neck as detailed above which could reflect vasculitis. Arterial vasculature in the head and neck is otherwise patent. Few scattered areas of mild calcified atherosclerosis. Focal moderate  stenosis of the distal A2 segment left ACA. 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 Emphysema. These results were called by telephone at the time of interpretation on 03/12/2024 at 6:34 pm to provider AMJAD ALI , who verbally acknowledged these results. Electronically Signed   By: Denny Flack M.D.   On: 03/12/2024 18:35   DG Chest Portable 1 View Result Date: 03/12/2024 CLINICAL DATA:  Chest pain EXAM: PORTABLE CHEST 1 VIEW COMPARISON:  11/09/2020 FINDINGS: The cardiac silhouette, mediastinal and hilar contours are within normal limits. No acute pulmonary findings. No infiltrates, edema or effusions. The bony thorax is intact. IMPRESSION: No acute cardiopulmonary findings. Electronically Signed   By: Marrian Siva M.D.   On: 03/12/2024 16:36     PHYSICAL EXAM  Temp:  [98.1 F (36.7 C)-98.8 F (37.1 C)] 98.2 F (36.8 C) (05/12 1640) Pulse Rate:  [55-79] 61 (05/12 1640) Resp:  [14-31] 18 (05/12 1640) BP: (159-211)/(67-120) 183/92 (05/12 1640) SpO2:  [96 %-100 %] 100 % (05/12 1640) Weight:  [86.2 kg] 86.2 kg (05/11 2148)  General - Well nourished, well developed, in no apparent distress.  Ophthalmologic - fundi not visualized due to noncooperation.  Cardiovascular - Regular rhythm and rate.  Mental Status -  Level of arousal and orientation to time, place, and person were intact. Language including expression, naming, repetition, comprehension was assessed and found intact.  Fund of Knowledge was assessed and was intact.  Cranial Nerves II - XII - II - Visual field intact OU. III, IV, VI - Extraocular movements intact. V - Facial sensation decreased on the right face VII - Facial movement intact bilaterally. VIII - Hearing & vestibular intact bilaterally. X - Palate elevates symmetrically. XI - Chin turning & shoulder shrug intact bilaterally. XII - Tongue protrusion intact.  Motor Strength - The patient's strength was normal in all extremities and pronator drift  was absent.  Bulk was normal and fasciculations were absent.   Motor Tone - Muscle tone was assessed at the neck and appendages and was normal.  Reflexes - The patient's reflexes were symmetrical in all extremities and he had no pathological reflexes.  Sensory - Light touch, temperature/pinprick were assessed and were symmetrical.    Coordination - The patient had normal movements in the hands and feet with no ataxia or dysmetria.  Tremor was absent.  Gait and Station - deferred.   ASSESSMENT/PLAN [name] is a 68 y.o. male with history of hypertension and smoker admitted for right-sided numbness, headache and significantly elevated BP. No TNK given due to outside window.    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  Consider aspirin tomorrow if BP stabilized. Patient counseled to be compliant with his antithrombotic medications Ongoing aggressive stroke risk factor management Therapy recommendations: Outpatient PT Disposition: Pending  Left parietal small ICH from cavernoma and high BP CT and MRI showed left parietal small ICH CT repeat stable CT head and neck showed right frontal DVA and 2012 brain MRI showed left parietal cavernoma BP goal less than 160 Consider aspirin tomorrow once BP better controlled  Hypertensive urgency Stable on the high end Resume home amlodipine and lisinopril Long term BP goal less than 160 Long-term BP goal normotensive  Hyperlipidemia Home meds: Lipitor 10 LDL 177, goal < 70 Now on Crestor 40 Continue statin at  discharge  Tobacco abuse Current smoker Smoking cessation counseling provided Nicotine patch provided Pt is willing to quit  Other Stroke Risk Factors Advanced age  Other Active Problems Leukocytosis WBC 10.0--11.0 AKI versus CKD, creatinine 1.50--1.46  Hospital day # 0    Consuelo Denmark, MD PhD Stroke Neurology 03/13/2024 5:13 PM    To contact Stroke Continuity provider, please refer to [email or web address]. After hours, contact General Neurology

## 2024-03-13 NOTE — ED Notes (Signed)
 Daughter called for update

## 2024-03-13 NOTE — H&P (Addendum)
 History and Physical    Demetris Whitesell ZOX:[id number] DOB: 12-Mar-1956 DOA: 03/12/2024  PCP: Pcp, No   Patient coming from: Home   Chief Complaint:  Chief Complaint  Patient presents with   Stroke like symptoms    HPI:  Tyone Huey is a 68 y.o. male with hx of hypertension, hyperlipidemia, CKD 3A, med nonadherence, who presented with acute onset of right-sided numbness.  Reports that symptoms started 5/10 around 530-630 p.m. with sudden right facial, arm, leg numbness.  The following morning noted that he was also having dysarthria and felt like he was "walking in a circle".  Denies any weakness, speech changes, vision changes.  No symptoms on the left side.  Denies any recent head injury or fall.  Reports having a gradual onset of a headache described as feeling like his head was including this morning.  No sudden onset or severe headache.  Rates 3 out of 10. was not taking any antiplatelets on a regular basis but did take 2 baby aspirin this morning.  He acknowledges nonadherence with medications including statin and antihypertensives because "feels funny" after taking them but no specific adverse effects.  Current smoker at 1 pack/day interested in stopping.   Review of Systems:  ROS complete and negative except as marked above   No Known Allergies  Prior to Admission medications   Medication Sig Start Date End Date Taking? Authorizing Provider  amLODipine (NORVASC) 5 MG tablet Take 5 mg by mouth daily. Patient not taking: Reported on 03/12/2024 08/18/21   [provider]  atorvastatin (LIPITOR) 10 MG tablet Take 10 mg by mouth at bedtime. Patient not taking: Reported on 03/12/2024 08/18/21   [provider]  lisinopril (ZESTRIL) 20 MG tablet Take 20 mg by mouth daily. Patient not taking: Reported on 03/12/2024 08/18/21   [provider]    Past Medical History:  Diagnosis Date   Hypertension     History reviewed. No pertinent surgical history.    reports that he has been smoking cigarettes. He has a 45 pack-year smoking history. He has been exposed to tobacco smoke. He has never used smokeless tobacco. He reports that he does not currently use alcohol. He reports that he does not use drugs.  History reviewed. No pertinent family history.   Physical Exam: Vitals:   03/12/24 2230 03/12/24 2326 03/13/24 0000 03/13/24 0030  BP: (!) 161/96  (!) 211/92 (!) 162/76  Pulse: 71  (!) 58 (!) 57  Resp: 18  16 16   Temp:  98.8 F (37.1 C)    TempSrc:  Oral    SpO2: 96%  99% 98%  Weight:      Height:        Gen: Awake, alert, NAD   CV: Regular, normal S1, S2, no murmurs  Resp: Normal WOB, mild wheezes in the upper lung fields Abd: Flat, normoactive, nontender MSK: Symmetric, no edema  Skin: No rashes or lesions to exposed skin  Neuro: Alert and interactive, fully oriented, speech with dysarthria, no aphasia. CNV with R facial hypoesthesia, CN II through XII otherwise are intact.  Motor is 5 out of 5 and symmetric.  Sensation with hypoesthesia on the right side (face, arm, leg).  Intact on the left.  Psych: euthymic, appropriate    Data review:   Labs reviewed, notable for:   Platelet 348 Tox negative High-sensitivity Trop 32 -> 50  Micro:  Results for orders placed or performed in visit on 08/21/21  CULTURE, URINE COMPREHENSIVE  Status: None   Collection Time: 08/21/21  3:31 PM   Specimen: Urine   UR  Result Value Ref Range Status   Urine Culture, Comprehensive Final report  Final   Organism ID, Bacteria Comment  Final    Comment: Mixed urogenital flora 1,000 Colonies/mL   Microscopic Examination     Status: None   Collection Time: 08/21/21  3:31 PM   Urine  Result Value Ref Range Status   WBC, UA 0-5 0 - 5 /hpf Final   RBC, Urine 0-2 0 - 2 /hpf Final   Epithelial Cells (non renal) None seen 0 - 10 /hpf Final   Bacteria, UA None seen None seen/Few Final    Imaging reviewed:  CT Head Wo Contrast Result Date:  03/12/2024 CLINICAL DATA:  Follow-up examination for TIA. EXAM: CT HEAD WITHOUT CONTRAST TECHNIQUE: Contiguous axial images were obtained from the base of the skull through the vertex without intravenous contrast. RADIATION DOSE REDUCTION: This exam was performed according to the departmental dose-optimization program which includes automated exposure control, adjustment of the mA and/or kV according to patient size and/or use of iterative reconstruction technique. COMPARISON:  Comparison made with prior studies from earlier the same day. FINDINGS: Brain: Previously identified small hyperdense focus involving the left parietal convexity again seen, relatively stable from prior (series 2, image 22). Given stability in review of prior MRI, it is postulated that this could reflect a small cavernoma, as this lesion appears to potentially be intra-axial in location on prior MRI. However, a small focus of acute hemorrhage remains difficult to exclude, with continued follow-up warranted. No other new acute intracranial hemorrhage. Acute left thalamic lacunar infarct again noted, better seen on prior MRI. No other acute large vessel territory infarct. No other mass lesion or midline shift. No hydrocephalus or extra-axial fluid collection. Vascular: Contrast material remains on board from prior CTA. Right frontal DVA again noted. Skull: Scalp soft tissues and calvarium demonstrate no new finding. Sinuses/Orbits: Globes orbital soft tissues within normal limits. Paranasal sinuses are clear. No mastoid effusion. Other: None. IMPRESSION: 1. Relatively stable small hyperdense focus involving the left parietal convexity. Upon review of prior MRI, there is question that this could potentially reflect a small cavernoma, although a small focus of acute hemorrhage remains difficult to exclude. Continued short interval follow-up to evaluate for stability versus resolution of this finding would likely be helpful for further evaluation.  2. Acute left thalamic lacunar infarct, better seen on prior MRI. 3. No other new acute intracranial abnormality. Electronically Signed   By: Virgia Griffins M.D.   On: 03/12/2024 23:32   MR BRAIN WO CONTRAST Result Date: 03/12/2024 CLINICAL DATA:  Transient ischemic attack, stroke-like symptoms starting yesterday evening, stops taking blood pressure medicines in November. EXAM: MRI HEAD WITHOUT CONTRAST TECHNIQUE: Multiplanar, multiecho pulse sequences of the brain and surrounding structures were obtained without intravenous contrast. COMPARISON:  Same day CTA head and neck.  MRI head 07/01/2011. FINDINGS: Brain: 9 mm focus of restricted diffusion within the left thalamus compatible with acute infarct. No significant associated edema or mass effect. There is additional subtle focus of diffusion signal abnormality within the right thalamus without ADC hypointensity which could reflect a small focus of subacute infarct. Focal susceptibility over the left parietal convexity corresponding to focus of subarachnoid hemorrhage noted on same day CTA. Branching curvilinear susceptibility in the anterior inferior right frontal lobe corresponding to developmental venous anomaly. Scattered and confluent T2/FLAIR hyperintensity in the periventricular and subcortical white matter with  additional signal abnormality in the pons. Remote lacunar infarcts in the bilateral corona radiata, basal ganglia, and thalami with additional small remote infarcts in the bilateral cerebellum. Remote infarct involving the left genu of the corpus callosum. Normal appearance of midline structures. The basilar cisterns are patent. No extra-axial fluid collections. Ventricles: Normal size and configuration of the ventricles. Vascular: Skull base flow voids are visualized. Skull and upper cervical spine: No focal abnormality. Sinuses/Orbits: Orbits are symmetric. Paranasal sinuses are clear. Other: Mastoid air cells are clear. IMPRESSION: Acute  infarct in the left thalamus. Additional punctate subacute infarct involving the right thalamus. Focal susceptibility over the left parietal convexity corresponding to subarachnoid hemorrhage noted on CTA. Nonspecific white matter signal abnormality which may reflect moderate chronic microvascular ischemic changes versus sequelae of other vasculopathy. Multiple remote infarcts in the bilateral corona radiata, basal ganglia, and thalami. Additional remote infarct in the left genu of the corpus callosum. Small remote infarcts in the bilateral cerebellum. Right frontal developmental venous anomaly. Findings of acute left flank infarct and left parietal subarachnoid hemorrhage were called by telephone at the time of CTA interpretation on 03/12/2024 at 6:34 pm to provider AMJAD ALI , who verbally acknowledged these results. Electronically Signed   By: Denny Flack M.D.   On: 03/12/2024 19:20   CT ANGIO HEAD NECK W WO CM Result Date: 03/12/2024 CLINICAL DATA:  Transient ischemic attack, EXAM: CT ANGIOGRAPHY HEAD AND NECK WITH AND WITHOUT CONTRAST TECHNIQUE: Multidetector CT imaging of the head and neck was performed using the standard protocol during bolus administration of intravenous contrast. Multiplanar CT image reconstructions and MIPs were obtained to evaluate the vascular anatomy. Carotid stenosis measurements (when applicable) are obtained utilizing NASCET criteria, using the distal internal carotid diameter as the denominator. RADIATION DOSE REDUCTION: This exam was performed according to the departmental dose-optimization program which includes automated exposure control, adjustment of the mA and/or kV according to patient size and/or use of iterative reconstruction technique. CONTRAST:  75mL OMNIPAQUE IOHEXOL 350 MG/ML SOLN COMPARISON:  Same day MRI head. FINDINGS: CT HEAD FINDINGS Brain: Focal subarachnoid hemorrhage over the left parietal convexity involving a single sulcus. No significant mass effect,  edema, or midline shift. Additional linear hyperattenuation within the anterior inferior right frontal parenchyma with findings suggestive of developmental venous anomaly on CTA and MRI. The basilar cisterns are patent. Posterior fossa is unremarkable. No evidence of completed large territory infarct. Nonspecific hypoattenuation in the periventricular and subcortical white matter favored to reflect chronic microvascular ischemic changes. Ventricles: Ventricles are normal in size and configuration. Vascular: No hyperdense vessel. Skull: No acute or aggressive finding. Sinuses/orbits: The visualized paranasal sinuses are clear. Orbits are symmetric. Other: Mastoid air cells are clear. CTA NECK FINDINGS Aortic arch: Standard configuration of the aortic arch. Imaged portion shows no evidence of aneurysm or dissection. Calcified and noncalcified atherosclerotic plaque along the visualized aortic arch extending into the proximal descending thoracic aorta. No significant stenosis of the major arch vessel origins. Pulmonary arteries: As permitted by contrast timing, there are no filling defects in the visualized pulmonary arteries. Subclavian arteries: Patent bilaterally. Noncalcified atherosclerotic plaque involving the origin of the left subclavian artery without significant stenosis. Right carotid system: No evidence of dissection, stenosis (50% or greater), or occlusion. There is mild irregularity of the proximal cervical ICA without significant narrowing. Left carotid system: No evidence of dissection, stenosis (50% or greater), or occlusion. Mild atherosclerosis along the proximal cervical ICA. There is also possible mild circumferential wall thickening along the proximal  cervical ICA. Vertebral arteries: Left vertebral artery is dominant. Severe stenosis and possible focal occlusion at the origin of the right vertebral artery. There is reconstitution of the right V1 segment immediately distal to the origin. The left  vertebral artery is patent from the origin to the vertebrobasilar confluence. There is irregularity and mild narrowing of the left V4 segment. No evidence of dissection. Skeleton: No acute findings. Degenerative changes in the cervical spine. Disc space narrowing is most pronounced at C5-6. Edentulous maxilla and mandible. Other neck: The visualized airway is patent. No cervical lymphadenopathy. Upper chest: Emphysema within the visualized lungs. Review of the MIP images confirms the above findings CTA HEAD FINDINGS ANTERIOR CIRCULATION: The intracranial ICAs are patent bilaterally. Mild atherosclerosis of the bilateral carotid siphons without significant stenosis. There is additional mild stenosis at the posterior vertical portion of the right cavernous ICA without definite atherosclerotic plaque. Mild irregularity of the bilateral cavernous ICAs. No high-grade stenosis, proximal occlusion, aneurysm, or vascular malformation. MCAs: The middle cerebral arteries are patent bilaterally. ACAs: The anterior cerebral arteries are patent bilaterally. Moderate stenosis of the distal A2 segment left ACA. POSTERIOR CIRCULATION: No significant stenosis, proximal occlusion, aneurysm, or vascular malformation. PCAs: The posterior cerebral arteries are patent bilaterally. Pcomm: Not well visualized. SCAs: The superior cerebellar arteries are patent bilaterally. Basilar artery: Patent AICAs: Not well visualized. PICAs: Patent Vertebral arteries: As above. Venous sinuses: As permitted by contrast timing, patent. Anatomic variants: None Review of the MIP images confirms the above findings IMPRESSION: Focal acute subarachnoid hemorrhage over the left parietal convexity involving a single sulcus. No edema, mass effect, or midline shift. Occlusion at the origin of the right vertebral artery with immediate reconstitution of the proximal right V1 segment. This may be due to noncalcified atherosclerotic plaque at the vessel origin.  However, there are multiple areas of mild vascular irregularity in the head and neck as detailed above which could reflect vasculitis. Arterial vasculature in the head and neck is otherwise patent. Few scattered areas of mild calcified atherosclerosis. Focal moderate stenosis of the distal A2 segment left ACA. 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 Emphysema. These results were called by telephone at the time of interpretation on 03/12/2024 at 6:34 pm to provider AMJAD ALI , who verbally acknowledged these results. Electronically Signed   By: Denny Flack M.D.   On: 03/12/2024 18:35   DG Chest Portable 1 View Result Date: 03/12/2024 CLINICAL DATA:  Chest pain EXAM: PORTABLE CHEST 1 VIEW COMPARISON:  11/09/2020 FINDINGS: The cardiac silhouette, mediastinal and hilar contours are within normal limits. No acute pulmonary findings. No infiltrates, edema or effusions. The bony thorax is intact. IMPRESSION: No acute cardiopulmonary findings. Electronically Signed   By: Marrian Siva M.D.   On: 03/12/2024 16:36    EKG:  Personally reviewed, sinus rhythm, LAE, no acute ischemic changes.  ED Course:  Treated with labetalol IV prn for severe range hypertension.  Due to an episode of imbalance while ambulating which was reportedly new since his initial presentation CT head was repeated which demonstrated stable SAH versus cavernoma.  EDP consulted neurology Dr. Alecia Ames who is seen the patient.   Assessment/Plan:  68 y.o. male with hx hypertension, hyperlipidemia, CKD 3a, med nonadherence, who presented with acute onset of right-sided numbness.  Found to have multifocal ischemic stroke involving bilateral thalamus, possible subarachnoid hemorrhage  Acute ischemic stroke, L thalamus Possible focal subarachnoid hemorrhage, left parietal convexity LKWT 5/10 ~ 530PM onset of right facial, arm, leg hypoesthesia.  Subsequently developing dysarthria, imbalance 5/11 AM. No hx head trauma. Took  aspirin 162 mg in the morning of 5/11 but no regular antiPLT use.  On initial evaluation noted to have severe range hypertension Max 232/105.  MRI brain without contrast demonstrating acute infarct in the left thalamus, subacute infarct in the right thalamus, multiple remote infarcts within bilateral corona radiata, basal ganglia, thalami, left genu of the corpus callosum, bilateral cerebellum. Also seen focal susceptibility over the left parietal convexity corresponding to subarachnoid hemorrhage seen on CTA. CTA head / neck showing Focal acute subarachnoid hemorrhage over the left parietal convexity involving a single sulcus, occlusion of the R vertebral artery origin with immediate reconstitution in V1, mild other vascular irregularity.  Although question of vasculitis raised via CT suspect vascular findings more likely to represent chronic uncontrolled hypertension. Interval CT head with stable appearance of subarachnoid hemorrhage, suggestion that this could represent a cavernoma instead.  -Neurology following, appreciate recommendations. - Antiplatelet held pending repeat CT Head in AM  - Statin switch atorvastatin to rosuvastatin 20 mg - Discussed hypertensive goals with neurology, in the setting of stroke and possible subarachnoid recommending for BP less than 180 systolic.  Ordered for labetalol 10 mg IV every 2 hours as needed for SBP > 180.  - Check lipids and A1c - Serial neurochecks  - Telemonitoring - TTE with bubble - PT/OT/SLP - DVT prophylaxis per below - Swallow screen then [facility] diet  - Smoking cessation below  Hypertensive emergency On initial evaluation noted to have severe range hypertension Max 232/105. BP improved although intermittent continued severe range HTN. Associated possible SAH per above, acute myocardial injury. Considering the uncertain nature of SAH and his concurrent stroke will hold off on continuous gtt for now, but consider if difficult to control severe range HTN   - See BP goal above; Ordered for labetalol 10 mg IV every 2 hours as needed for SBP > 180.  - Can add on oral antihypertensives tomorrow  Acute myocardial injury Reports brief episode of chest pain this morning.  EKG with no acute ischemic changes.  High-sensitivity troponin 32 -> 50.  Suspect this is demand ischemia in the setting of severe range uncontrolled hypertension - See stroke management re: antiplatelet, holding in setting of possible subarachnoid hemorrhage. - Additional risk factor modification per stroke above, checking lipids A1c  Smoking cessation Current smoker at 1 pack/day.  Interested in stopping - Counseled on smoking cessation - Nicotine 14 mg patch and gum as needed for cravings.  Rx at discharge.  Incidental finding: Right vertebral artery origin occlusion, with reconstitution of V1 Question of vasculitic changes: Check ESR, CRP.  Feel less likely with clinical history.  Chronic medical problems Hyperlipidemia: Checking lipids, changing to rosuvastatin CKD 3A: Baseline creatinine near 1.5.   Body mass index is 28.89 kg/m.    DVT prophylaxis:  SCDs Code Status:  Full Code Diet:  Diet Orders (From admission, onward)     Start     Ordered   03/13/24 0023  Diet NPO time specified  Diet effective now        03/13/24 0023           Family Communication: Yes discussed with his wife at the bedside Consults: Neurology Admission status:   Inpatient, Step Down Unit  Severity of Illness: The appropriate patient status for this patient is INPATIENT. Inpatient status is judged to be reasonable and necessary in order to provide the required intensity of service to ensure the patient's  safety. The patient's presenting symptoms, physical exam findings, and initial radiographic and laboratory data in the context of their chronic comorbidities is felt to place them at high risk for further clinical deterioration. Furthermore, it is not anticipated that the patient  will be medically stable for discharge from the hospital within 2 midnights of admission.   * I certify that at the point of admission it is my clinical judgment that the patient will require inpatient hospital care spanning beyond 2 midnights from the point of admission due to high intensity of service, high risk for further deterioration and high frequency of surveillance required.*   Arnulfo Larch, MD [facility]  How to contact the [facility] Attending or Consulting provider 7A - 7P or covering provider during after hours 7P -7A, for this patient.  Check the care team in [facility] and look for a) attending/consulting [facility] provider listed and b) the [facility] team listed Log into [email or web address] and use [place]'s universal password to access. If you do not have the password, please contact the hospital operator. Locate the [facility] provider you are looking for under [facility] and page to a number that you can be directly reached. If you still have difficulty reaching the provider, please page the [facility] (Director on Call) for the Hospitalists listed on amion for assistance.  03/13/2024, 12:57 AM

## 2024-03-13 NOTE — Consult Note (Signed)
 NEUROLOGY CONSULT NOTE   Date of service: Mar 13, 2024 Patient Name: Ludlow Achille MRN:  [id number] DOB:  1956/02/07 Chief Complaint: "right facial numbness" Requesting Provider: Arnulfo Larch, MD  History of Present Illness  Caileb Redhead is a 68 y.o. male with hx of hypertension who presents with right-sided numbness that started yesterday.  Due to the symptoms he sought care in the emergency department today where an MRI was performed demonstrating a left thalamic infarct as well as a tiny focus of potential local subarachnoid hemorrhage in the right parietal region.  His blood pressure was exceedingly high on presentation with a systolic BP of 254.  It is since come down and at the time of my evaluation he was in the 170s.  He had headache previously, but not a progressive worsening headaches as would be expected from vasculitis.  LKW: 5:30 PM 5/10 Modified rankin score: 0-Completely asymptomatic and back to baseline post- stroke IV Thrombolysis: No, outside of window EVT: No, no LVO  NIHSS components Score: Comment  1a Level of Conscious 0[x]  1[]  2[]  3[]      1b LOC Questions 0[x]  1[]  2[]       1c LOC Commands 0[x]  1[]  2[]       2 Best Gaze 0[x]  1[]  2[]       3 Visual 0[x]  1[]  2[]  3[]      4 Facial Palsy 0[]  1[x]  2[]  3[]      5a Motor Arm - left 0[x]  1[]  2[]  3[]  4[]  UN[]    5b Motor Arm - Right 0[x]  1[]  2[]  3[]  4[]  UN[]    6a Motor Leg - Left 0[x]  1[]  2[]  3[]  4[]  UN[]    6b Motor Leg - Right 0[x]  1[]  2[]  3[]  4[]  UN[]    7 Limb Ataxia 0[]  1[x]  2[]  UN[]      8 Sensory 0[]  1[x]  2[]  UN[]      9 Best Language 0[x]  1[]  2[]  3[]      10 Dysarthria 0[]  1[x]  2[]  UN[]      11 Extinct. and Inattention 0[x]  1[]  2[]       TOTAL: 4      Past History   Past Medical History:  Diagnosis Date   Hypertension     History reviewed. No pertinent surgical history.  Family History: History reviewed. No pertinent family history.  Social History  reports that he has been smoking cigarettes. He has a  45 pack-year smoking history. He has been exposed to tobacco smoke. He has never used smokeless tobacco. He reports that he does not currently use alcohol. He reports that he does not use drugs.  No Known Allergies  Medications   Current Facility-Administered Medications:    acetaminophen (TYLENOL) tablet 1,000 mg, 1,000 mg, Oral, Once, Ceclia Cohens, Amjad, PA-C   labetalol (NORMODYNE) injection 10 mg, 10 mg, Intravenous, Q2H PRN, Segars, Jonathan, MD, 10 mg at 03/13/24 0004  Current Outpatient Medications:    amLODipine (NORVASC) 5 MG tablet, Take 5 mg by mouth daily. (Patient not taking: Reported on 03/12/2024), Disp: , Rfl:    atorvastatin (LIPITOR) 10 MG tablet, Take 10 mg by mouth at bedtime. (Patient not taking: Reported on 03/12/2024), Disp: , Rfl:    lisinopril (ZESTRIL) 20 MG tablet, Take 20 mg by mouth daily. (Patient not taking: Reported on 03/12/2024), Disp: , Rfl:   Vitals   Vitals:   03/12/24 2153 03/12/24 2230 03/12/24 2326 03/13/24 0000  BP:  (!) 161/96  (!) 211/92  Pulse:  71  (!) 58  Resp:  18  16  Temp: 98.1 F (36.7 C)  98.8 F (37.1 C)   TempSrc: Oral  Oral   SpO2:  96%  99%  Weight:      Height:        Body mass index is 28.89 kg/m.  Physical Exam   Constitutional: Appears well-developed and well-nourished.  Neurologic Examination    Neuro: Mental Status: Patient is awake, alert, oriented to person, place, month, year, and situation. Patient is able to give a clear and coherent history. No signs of aphasia or neglect Cranial Nerves: II: Visual Fields are full. Pupils are equal, round, and reactive to light.   III,IV, VI: EOMI without ptosis or diploplia.  V: Facial sensation is symmetric to temperature VII: Facial movement is symmetric.  VIII: hearing is intact to voice X: Uvula elevates symmetrically XII: tongue is midline without atrophy or fasciculations.  Motor: Tone is normal. Bulk is normal. 5/5 strength was present in all four extremities.   Sensory: Sensation is symmetric to light touch and temperature in the arms and legs. Cerebellar: He has mild right arm ataxia out of proportion to weakness, otherwise intact        Labs/Imaging/Neurodiagnostic studies   CBC:  Recent Labs  Lab 04/06/2024 1435 06-Apr-2024 1548  WBC 10.0  --   NEUTROABS 6.9  --   HGB 16.4 16.0  HCT 48.7 47.0  MCV 84.7  --   PLT 348  --    Basic Metabolic Panel:  Lab Results  Component Value Date   NA 140 2024/04/06   K 3.9 2024-04-06   CO2 18 (L) 11/09/2020   GLUCOSE 91 04/06/24   BUN 19 04/06/2024   CREATININE 1.50 (H) April 06, 2024   CALCIUM 8.9 11/09/2020   GFRNONAA 52 (L) 11/09/2020   Lipid Panel: No results found for: "LDLCALC" HgbA1c: No results found for: "HGBA1C" Urine Drug Screen:     Component Value Date/Time   LABOPIA NONE DETECTED 04/06/24 1433   COCAINSCRNUR NONE DETECTED 04-06-2024 1433   LABBENZ NONE DETECTED 06-Apr-2024 1433   AMPHETMU NONE DETECTED 2024-04-06 1433   THCU NONE DETECTED 06-Apr-2024 1433   LABBARB NONE DETECTED 04-06-2024 1433    Alcohol Level     Component Value Date/Time   ETH <15 06-Apr-2024 1426    CT Head without contrast(Personally reviewed): Small focal hyperdensity  CT angio Head and Neck with contrast(Personally reviewed): No aneurysm, multifocal areas of irregularity, read as possible atheromatous disease versus vasculitis  MRI Brain(Personally reviewed): Small focus of potential bleed in the left parietal region, left thalamic infarct  ASSESSMENT   Brayven Seidman is a 68 y.o. male with a left thalamic infarct.  The tiny area of possible hemorrhage at the cortical surface could be a tiny subarachnoid.  My suspicion if this is the case is that it was secondary to his hypertensive crisis as a result of his ischemic infarct.  It is also possible this represents a tiny cavernoma per radiology, and therefore I think continued follow-up would be reasonable.  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   RECOMMENDATIONS  - Repeat CT in the morning, consider antiplatelet therapy if stable - HgbA1c, fasting lipid panel - Frequent neuro checks - Echocardiogram - Prophylactic therapy-none for now - Risk factor modification - Telemetry monitoring - PT consult, OT consult, Speech consult - Stroke team to follow  ______________________________________________________________________  Signed, Ann Keto, MD [facility]

## 2024-03-13 NOTE — Progress Notes (Signed)
  Echocardiogram 2D Echocardiogram has been performed.  Dione Franks 03/13/2024, 9:56 AM

## 2024-03-13 NOTE — Evaluation (Signed)
 Physical Therapy Evaluation Patient Details Name: Sean Reilly MRN: [id number] DOB: 1956/10/26 Today's Date: 03/13/2024  History of Present Illness  68 y.o. male presents to [facility] hospital on 03/12/2024 with sudden onset R numbness and dysarthria. CT head reveals acute infarct in L thalamus, subacute infarct in R thalamus. CTA notable for [facility] over the L parietal convexity. PMH includes HTN, HLD, CKD III.  Clinical Impression  Pt presents to PT with deficits in sensation, gait, balance. PT notes decrease sensation in RLE along with digits 3-5 on R hand. Pt demonstrates losses of balance to R side when provided with dynamic gait challenges, otherwise without significant instability when ambulating. Pt will benefit from continued frequent ambulation in an effort to improve balance and gait quality. Pt would benefit from use of a cane as well as outpatient PT at the time of discharge to allow for improved balance.        If plan is discharge home, recommend the following: A little help with walking and/or transfers;A little help with bathing/dressing/bathroom;Assistance with cooking/housework;Assist for transportation;Help with stairs or ramp for entrance   Can travel by private vehicle        Equipment Recommendations Jeananne Mighty (pt may elect to purchase a cane independently)  Recommendations for Other Services       Functional Status Assessment Patient has had a recent decline in their functional status and demonstrates the ability to make significant improvements in function in a reasonable and predictable amount of time.     Precautions / Restrictions Precautions Precautions: Fall Recall of Precautions/Restrictions: Intact Restrictions Weight Bearing Restrictions Per Provider Order: No      Mobility  Bed Mobility Overal bed mobility: Independent                  Transfers Overall transfer level: Needs assistance Equipment used: None Transfers: Sit to/from Stand Sit to Stand:  Supervision                Ambulation/Gait Ambulation/Gait assistance: Contact guard assist Gait Distance (Feet): 250 Feet Assistive device: None Gait Pattern/deviations: Step-through pattern, Drifts right/left Gait velocity: reduced Gait velocity interpretation: 1.31 - 2.62 ft/sec, indicative of limited community ambulator   General Gait Details: pt with intermittent rightward drift with dynamic gait challenges including head turns, changes in stride length and backward walking. Pt is able to self-correct losses of balance  Stairs            Wheelchair Mobility     Tilt Bed    Modified Rankin (Stroke Patients Only) Modified Rankin (Stroke Patients Only) Pre-Morbid Rankin Score: No symptoms Modified Rankin: Moderately severe disability     Balance Overall balance assessment: Needs assistance Sitting-balance support: No upper extremity supported, Feet supported Sitting balance-Leahy Scale: Good     Standing balance support: No upper extremity supported, During functional activity Standing balance-Leahy Scale: Good           Rhomberg - Eyes Opened: 30 Rhomberg - Eyes Closed: 30                 Pertinent Vitals/Pain Pain Assessment Pain Assessment: No/denies pain    Home Living Family/patient expects to be discharged to:: Private residence Living Arrangements: Spouse/significant other;Other relatives Available Help at Discharge: Family;Available 24 hours/day Type of Home: Mobile home Home Access: Stairs to enter Entrance Stairs-Rails: Can reach both Entrance Stairs-Number of Steps: 5   Home Layout: One level Home Equipment: Agricultural consultant (2 wheels);Rollator (4 wheels)      Prior Function  Prior Level of Function : Independent/Modified Independent             Mobility Comments: ambulatory without DME       Extremity/Trunk Assessment   Upper Extremity Assessment Upper Extremity Assessment: RUE deficits/detail RUE Deficits /  Details: ROM and strength WFL RUE Sensation: decreased light touch RUE Coordination: decreased fine motor;decreased gross motor (mild coordination impairments, increased time)    Lower Extremity Assessment Lower Extremity Assessment: RLE deficits/detail RLE Deficits / Details: ROM and strength WFL RLE Sensation: decreased light touch    Cervical / Trunk Assessment Cervical / Trunk Assessment: Normal  Communication   Communication Communication: No apparent difficulties    Cognition Arousal: Alert Behavior During Therapy: WFL for tasks assessed/performed   PT - Cognitive impairments: No apparent impairments                         Following commands: Intact       Cueing Cueing Techniques: Verbal cues     General Comments General comments (skin integrity, edema, etc.): pt remains hypertensive, systolic BP 161 pre-mobility and over 180 after ambulation    Exercises     Assessment/Plan    PT Assessment Patient needs continued PT services  PT Problem List Decreased balance;Decreased mobility;Impaired sensation       PT Treatment Interventions DME instruction;Stair training;Gait training;Functional mobility training;Therapeutic activities;Therapeutic exercise;Balance training;Neuromuscular re-education;Patient/family education    PT Goals (Current goals can be found in the Care Plan section)  Acute Rehab PT Goals Patient Stated Goal: to return to independence PT Goal Formulation: With patient Time For Goal Achievement: 03/27/24 Potential to Achieve Goals: Good Additional Goals Additional Goal #1: Pt will score >19/24 on the DGI to indicate a reduced risk for falls Additional Goal #2: Pt will score >45/56 on the BERG to indicate a reduced risk for falls    Frequency Min 3X/week     Co-evaluation               AM-PAC PT "6 Clicks" Mobility  Outcome Measure Help needed turning from your back to your side while in a flat bed without using  bedrails?: None Help needed moving from lying on your back to sitting on the side of a flat bed without using bedrails?: None Help needed moving to and from a bed to a chair (including a wheelchair)?: A Little Help needed standing up from a chair using your arms (e.g., wheelchair or bedside chair)?: A Little Help needed to walk in hospital room?: A Little Help needed climbing 3-5 steps with a railing? : A Little 6 Click Score: 20    End of Session Equipment Utilized During Treatment: Gait belt Activity Tolerance: Patient tolerated treatment well Patient left: in bed;with call bell/phone within reach;with family/visitor present Nurse Communication: Mobility status PT Visit Diagnosis: Other abnormalities of gait and mobility (R26.89);Other symptoms and signs involving the nervous system (R29.898)    Time: 1610-9604 PT Time Calculation (min) (ACUTE ONLY): 26 min   Charges:   PT Evaluation $PT Eval Low Complexity: 1 Low   PT General Charges $$ ACUTE PT VISIT: 1 Visit         Rexie Catena, PT, DPT Acute Rehabilitation Office [phone]   Rexie Catena 03/13/2024, 12:34 PM

## 2024-03-13 NOTE — ED Notes (Signed)
 Called to dietary for a tray

## 2024-03-13 NOTE — Progress Notes (Addendum)
 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  Cavernoma when compared to 2012 imaging  Neurology consulted recommended stroke workup lowering blood pressure below 180 systolic Also recommending moderate blood pressure control etc. etc.  BP (!) 176/86   Pulse 67   Temp 98.4 F (36.9 C)   Resp 18   Ht 5\' 8"  (1.727 m)   Wt 86.2 kg   SpO2 99%   BMI 28.89 kg/m   He is feeling fine at bedside he has no further deficits although feels weak on the right Finger-nose-finger intact extraocular movements intact moving 4 limbs equally Abdominal soft no rebound Reflexes 2/3  P Complete stroke workup-needs better control BP prior to discharge with emphasis on adherence to meds Likely should be able to discharge home once all workup done Follow-up echo

## 2024-03-14 ENCOUNTER — Other Ambulatory Visit (HOSPITAL_COMMUNITY): Payer: Self-pay

## 2024-03-14 DIAGNOSIS — I6389 Other cerebral infarction: Secondary | ICD-10-CM | POA: Diagnosis not present

## 2024-03-14 DIAGNOSIS — I619 Nontraumatic intracerebral hemorrhage, unspecified: Secondary | ICD-10-CM | POA: Diagnosis not present

## 2024-03-14 DIAGNOSIS — I739 Peripheral vascular disease, unspecified: Secondary | ICD-10-CM | POA: Diagnosis not present

## 2024-03-14 DIAGNOSIS — I6381 Other cerebral infarction due to occlusion or stenosis of small artery: Secondary | ICD-10-CM | POA: Diagnosis not present

## 2024-03-14 DIAGNOSIS — I639 Cerebral infarction, unspecified: Secondary | ICD-10-CM | POA: Diagnosis not present

## 2024-03-14 MED ORDER — METOPROLOL SUCCINATE ER 25 MG PO TB24
12.5000 mg | ORAL_TABLET | Freq: Every day | ORAL | 3 refills | Status: AC
  Filled 2024-03-14: qty 30, 60d supply, fill #0

## 2024-03-14 MED ORDER — NICOTINE 14 MG/24HR TD PT24
14.0000 mg | MEDICATED_PATCH | Freq: Every day | TRANSDERMAL | 0 refills | Status: DC
  Filled 2024-03-14: qty 28, 28d supply, fill #0

## 2024-03-14 MED ORDER — ASPIRIN 81 MG PO TBEC
81.0000 mg | DELAYED_RELEASE_TABLET | Freq: Every day | ORAL | Status: DC
  Administered 2024-03-14: 81 mg via ORAL
  Filled 2024-03-14: qty 1

## 2024-03-14 MED ORDER — NICOTINE POLACRILEX 2 MG MT GUM
2.0000 mg | CHEWING_GUM | OROMUCOSAL | 0 refills | Status: DC | PRN
  Filled 2024-03-14: qty 100, fill #0

## 2024-03-14 MED ORDER — METOPROLOL SUCCINATE ER 25 MG PO TB24
12.5000 mg | ORAL_TABLET | Freq: Every day | ORAL | Status: DC
  Administered 2024-03-14: 12.5 mg via ORAL
  Filled 2024-03-14: qty 1

## 2024-03-14 MED ORDER — AMLODIPINE BESYLATE 5 MG PO TABS
10.0000 mg | ORAL_TABLET | Freq: Every day | ORAL | Status: DC
  Administered 2024-03-14: 10 mg via ORAL
  Filled 2024-03-14: qty 2

## 2024-03-14 MED ORDER — ROSUVASTATIN CALCIUM 40 MG PO TABS
40.0000 mg | ORAL_TABLET | Freq: Every day | ORAL | 3 refills | Status: AC
  Filled 2024-03-14: qty 30, 30d supply, fill #0

## 2024-03-14 MED ORDER — ASPIRIN 81 MG PO TBEC: 81.0000 mg | DELAYED_RELEASE_TABLET | Freq: Every day | ORAL | Status: AC

## 2024-03-14 MED ORDER — AMLODIPINE BESYLATE 10 MG PO TABS
10.0000 mg | ORAL_TABLET | Freq: Every day | ORAL | 3 refills | Status: AC
  Filled 2024-03-14: qty 30, 30d supply, fill #0

## 2024-03-14 MED ORDER — LISINOPRIL 20 MG PO TABS
20.0000 mg | ORAL_TABLET | Freq: Every day | ORAL | 3 refills | Status: AC
  Filled 2024-03-14: qty 30, 30d supply, fill #0

## 2024-03-14 NOTE — Evaluation (Signed)
 Speech Language Pathology Evaluation Patient Details Name: Parnell Caez MRN: [id number] DOB: May 25, 1956 Today's Date: 03/14/2024 Time: 4098-1191 SLP Time Calculation (min) (ACUTE ONLY): 21 min  Problem List:  Patient Active Problem List   Diagnosis Date Noted   Acute ischemic stroke (HCC) 03/13/2024   Past Medical History:  Past Medical History:  Diagnosis Date   Hypertension    Past Surgical History: History reviewed. No pertinent surgical history. HPI:  68 y.o. male presents to [facility] hospital on 03/12/2024 with sudden onset R numbness and dysarthria. CT head reveals acute infarct in L thalamus, subacute infarct in R thalamus. CTA notable for [facility] over the L parietal convexity. PMH includes HTN, HLD, CKD III; ST consulted for speech/language assessment.   Assessment / Plan / Recommendation Clinical Impression  Pt seen for speech/language assessment with [employer] Mental Status Examination with a total score obtained of 27/30 which falls within normal limits, but pt did exhibit decreased recall of new information with 2/5 objects after a time delay, achieving 60% accuracy.  He recalled all 5 objects with category cues given.  Pt transposed 2 numbers in a 4 digit number when recalling in reverse order, but otherwise attention appeared adequate for other tasks.  He was Ox4, speech was intelligible within complex conversation and OME revealed slight R facial sensory impairment.  He denies dysphagia and passed Yale swallow screen.  Recommend per observations and from SLUMS assessment results for pt to f/u at next venue of care with OP SLP prn for memory retrieval deficits and further cognitive assessment prn.  Thank you for this consult.  ST will s/o in acute setting.    SLP Assessment  SLP Recommendation/Assessment: All further Speech Language Pathology  needs can be addressed in the next venue of care SLP Visit Diagnosis: Cognitive communication deficit (R41.841)    Recommendations  for follow up therapy are one component of a multi-disciplinary discharge planning process, led by the attending physician.  Recommendations may be updated based on patient status, additional functional criteria and insurance authorization.    Follow Up Recommendations  Outpatient SLP (prn)    Assistance Recommended at Discharge  Intermittent Supervision/Assistance  Functional Status Assessment Patient has had a recent decline in their functional status and demonstrates the ability to make significant improvements in function in a reasonable and predictable amount of time.  Frequency and Duration  (evaluation only)         SLP Evaluation Cognition  Overall Cognitive Status: No family/caregiver present to determine baseline cognitive functioning Arousal/Alertness: Awake/alert Orientation Level: Oriented X4 Attention: Sustained Sustained Attention: Appears intact Memory: Impaired Memory Impairment: Decreased short term memory;Decreased recall of new information Decreased Short Term Memory: Verbal basic;Functional basic Awareness: Appears intact Problem Solving: Appears intact Safety/Judgment: Appears intact       Comprehension  Auditory Comprehension Overall Auditory Comprehension: Other (comment) (appeared WFL, but pt noted "it's a little slower") Commands: Within Functional Limits Conversation: Complex Visual Recognition/Discrimination Discrimination: Within Function Limits Reading Comprehension Reading Status: Within funtional limits    Expression Expression Primary Mode of Expression: Verbal Verbal Expression Overall Verbal Expression: Appears within functional limits for tasks assessed Level of Generative/Spontaneous Verbalization: Conversation Naming: No impairment Pragmatics: No impairment Non-Verbal Means of Communication: Not applicable Written Expression Dominant Hand: Right Written Expression: Within Functional Limits   Oral / Motor  Oral Motor/Sensory  Function Overall Oral Motor/Sensory Function: Mild impairment Facial ROM: Within Functional Limits Facial Symmetry: Within Functional Limits Facial Strength: Within Functional Limits Facial Sensation: Reduced right  Lingual ROM: Within Functional Limits Lingual Symmetry: Within Functional Limits Motor Speech Overall Motor Speech: Appears within functional limits for tasks assessed Respiration: Within functional limits Phonation: Normal Resonance: Within functional limits Articulation: Within functional limitis Intelligibility: Intelligible Motor Planning: Witnin functional limits Motor Speech Errors: Not applicable            Pat Hila Bolding,M.S.,CCC-SLP 03/14/2024, 1:00 PM

## 2024-03-14 NOTE — Plan of Care (Signed)

## 2024-03-14 NOTE — Discharge Summary (Signed)
 Physician Discharge Summary  Sean Reilly ZOX:[id number] DOB: 1955-12-27 DOA: 03/12/2024  PCP: Pcp, No  Admit date: 03/12/2024 Discharge date: 03/14/2024  Time spent: 50 minutes  Recommendations for Outpatient Follow-up:  Requires compliance check on meds Get A1c lipid panel in 3 months Outpatient therapy outpatient neurology follow-up recommended  Discharge Diagnoses:  MAIN problem for hospitalization   Small stroke with left thalamus infarct and tiny subarachnoid focus  Please see below for itemized issues addressed in HOpsital- refer to other progress notes for clarity if needed  Discharge Condition: Good  Diet recommendation: Heart healthy  Filed Weights   03/12/24 2148  Weight: 86.2 kg    History of present illness:  68 year old male known history of 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 etc.  He completed stroke workup with echo showing EF 60-65% no acute component His total cholesterol was 246 LDL was 177 and he was placed on Crestor high-dose Although his troponin was slightly elevated this was felt to be heart strain he was not having any chest pain He does have CKD 3 AA on it admission and will require close monitoring on his medications And will need labs in about a week He should follow-up with neurology as well as with outpatient therapy  Discharge  Exam: Vitals:   03/14/24 0728 03/14/24 1114  BP: (!) 162/66 (!) 165/77  Pulse: 63 72  Resp: 20 20  Temp: 98.2 F (36.8 C) 98.3 F (36.8 C)  SpO2: 95% 98%    Subj on day of d/c   Well no distress looks comfortable  General Exam on discharge  EOMI NCAT no focal deficit no icterus no pallor no wheeze no rales no rhonchi Chest clear Abdominal soft no rebound Finger-nose-finger intact No altered reflexes No lower extremity edema Shoulder shrug intact Vision by direct confrontation intact  Discharge Instructions   Discharge Instructions     Ambulatory referral to Physical Therapy   Complete by: As directed    Diet - low sodium heart healthy   Complete by: As directed    Discharge instructions   Complete by: As directed    Make sure you are compliant with medications as indicated you will need to continue all your blood pressure meds cholesterol meds etc. and we will call them and [facility] local pharmacy Please quit smoking Follow-up with your primary physician and follow-up with neurology in outpatient therapy If you have high-grade fever chills nausea vomiting or severe weakness on one-sided body please let somebody know or come to the emergency room   Increase activity slowly   Complete by: As directed       Allergies as of 03/14/2024   No Known Allergies      Medication List     STOP taking these medications    atorvastatin 10 MG tablet Commonly known as: LIPITOR       TAKE these medications    amLODipine 10 MG tablet Commonly known as: NORVASC Take 1  tablet (10 mg total) by mouth daily. Start taking on: Mar 15, 2024 What changed:  medication strength how much to take   aspirin EC 81 MG tablet Take 1 tablet (81 mg total) by mouth daily. Swallow whole.   lisinopril 20 MG tablet Commonly known as: ZESTRIL Take 1 tablet (20 mg total) by mouth daily.   metoprolol succinate 25 MG 24 hr tablet Commonly known as: TOPROL-XL Take 0.5 tablets (12.5  mg total) by mouth daily. Start taking on: Mar 15, 2024   nicotine 14 mg/24hr patch Commonly known as: NICODERM CQ - dosed in mg/24 hours Place 1 patch (14 mg total) onto the skin daily. Start taking on: Mar 15, 2024   nicotine polacrilex 2 MG gum Commonly known as: NICORETTE Take 1 each (2 mg total) by mouth as needed for smoking cessation.   rosuvastatin 40 MG tablet Commonly known as: CRESTOR Take 1 tablet (40 mg total) by mouth daily. Start taking on: Mar 15, 2024       No Known Allergies  Follow-up Information     [facility] Follow up.   Specialty: Rehabilitation Why: [facility] will contact you for the first appointment Contact information: [address] [place]  [address] [phone]                 The results of significant diagnostics from this hospitalization (including imaging, microbiology, ancillary and laboratory) are listed below for reference.    Significant Diagnostic Studies: ECHOCARDIOGRAM COMPLETE Result Date: 03/13/2024    ECHOCARDIOGRAM REPORT   Patient Name:   Sean Reilly Date of Exam: 03/13/2024 Medical Rec #:  [id number]       Height:       68.0 in Accession #:    [id number]      Weight:       190.0 lb Date of Birth:  May 11, 1956        BSA:          2.000 m Patient Age:    67 years        BP:           179/98 mmHg Patient Gender: M               HR:           60 bpm. Exam Location:  Inpatient Procedure: 2D Echo (Both Spectral and Color Flow Doppler were utilized during            procedure). Indications:    stroke  History:        Patient has no prior history of Echocardiogram examinations.                 Chronic kidney disease; Risk Factors:Current Smoker,                 Hypertension and Dyslipidemia.  Sonographer:    Dione Franks RDCS Referring Phys: [id number] Arnulfo Larch  Sonographer Comments: Image acquisition challenging due to respiratory motion. IMPRESSIONS  1. Left  ventricular ejection fraction, by estimation, is 55 to 60%. The left ventricle has normal function. The left ventricle has no regional wall motion abnormalities. Left ventricular diastolic parameters are consistent with Grade I diastolic dysfunction (impaired relaxation).  2. Right ventricular systolic function is normal. The right ventricular size is normal. Tricuspid regurgitation signal is inadequate for assessing PA pressure.  3. The mitral valve is normal in structure. No evidence of mitral valve regurgitation. No evidence of mitral  stenosis.  4. The aortic valve is tricuspid. Aortic valve regurgitation is not visualized. No aortic stenosis is present.  5. The inferior vena cava is normal in size with greater than 50% respiratory variability, suggesting right atrial pressure of 3 mmHg. FINDINGS  Left Ventricle: Left ventricular ejection fraction, by estimation, is 55 to 60%. The left ventricle has normal function. The left ventricle has no regional wall motion abnormalities. The left ventricular internal cavity size was normal in size. There is  no left ventricular hypertrophy. Left ventricular diastolic parameters are consistent with Grade I diastolic dysfunction (impaired relaxation). Right Ventricle: The right ventricular size is normal. No increase in right ventricular wall thickness. Right ventricular systolic function is normal. Tricuspid regurgitation signal is inadequate for assessing PA pressure. Left Atrium: Left atrial size was normal in size. Right Atrium: Right atrial size was normal in size. Pericardium: There is no evidence of pericardial effusion. Mitral Valve: The mitral valve is normal in structure. No evidence of mitral valve regurgitation. No evidence of mitral valve stenosis. Tricuspid Valve: The tricuspid valve is normal in structure. Tricuspid valve regurgitation is trivial. Aortic Valve: The aortic valve is tricuspid. Aortic valve regurgitation is not visualized. No aortic stenosis is  present. Pulmonic Valve: The pulmonic valve was normal in structure. Pulmonic valve regurgitation is not visualized. Aorta: The aortic root is normal in size and structure. Venous: The inferior vena cava is normal in size with greater than 50% respiratory variability, suggesting right atrial pressure of 3 mmHg. IAS/Shunts: No atrial level shunt detected by color flow Doppler.  LEFT VENTRICLE PLAX 2D LVIDd:         4.90 cm   Diastology LVIDs:         3.30 cm   LV e' medial:    3.81 cm/s LV PW:         1.30 cm   LV E/e' medial:  16.2 LV IVS:        1.00 cm   LV e' lateral:   5.87 cm/s LVOT diam:     2.10 cm   LV E/e' lateral: 10.5 LV SV:         72 LV SV Index:   36 LVOT Area:     3.46 cm  RIGHT VENTRICLE             IVC RV Basal diam:  2.30 cm     IVC diam: 1.40 cm RV S prime:     10.90 cm/s TAPSE (M-mode): 1.4 cm LEFT ATRIUM             Index        RIGHT ATRIUM           Index LA diam:        3.70 cm 1.85 cm/m   RA Area:     14.40 cm LA Vol (A2C):   37.2 ml 18.60 ml/m  RA Volume:   34.30 ml  17.15 ml/m LA Vol (A4C):   46.5 ml 23.25 ml/m LA Biplane Vol: 42.3 ml 21.15 ml/m  AORTIC VALVE LVOT Vmax:   85.20 cm/s LVOT Vmean:  57.500 cm/s LVOT VTI:    0.209 m  AORTA Ao Root diam: 3.60 cm MITRAL VALVE MV Area (PHT): 2.54 cm     SHUNTS MV Decel Time: 299 msec     Systemic VTI:  0.21 m MV E velocity: 61.60 cm/s   Systemic Diam: 2.10 cm MV A velocity: 107.00 cm/s MV E/A ratio:  0.58 [employer]  signed by Archer Bear Signature Date/Time: 03/13/2024/6:02:31 PM    Final    CT HEAD WO CONTRAST ( ) Result Date: 03/13/2024 CLINICAL DATA:  Follow-up subdural hematoma EXAM: CT HEAD WITHOUT CONTRAST TECHNIQUE: Contiguous axial images were obtained from the base of the skull through the vertex without intravenous contrast. RADIATION DOSE REDUCTION: This exam was performed according to the departmental dose-optimization program which includes automated exposure control, adjustment of the mA and/or kV  according to patient size and/or use of iterative reconstruction technique. 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 Tubular high-density at the right frontal lobe attributed to developmental venous anomaly, stable. 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 Chronic lacunes at the corpus callosum and right caudate. No acute hemorrhage, hydrocephalus, or mass. Vascular: No hyperdense vessel or unexpected calcification. Skull: Normal. Negative for fracture or focal lesion. Sinuses/Orbits: No acute finding. 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 No interval evolution at the acute left thalamic lacunar infarct. Electronically Signed   By: Ronnette Coke M.D.   On: 03/13/2024 06:43   CT Head Wo Contrast Result Date: 03/12/2024 CLINICAL DATA:  Follow-up examination for TIA. EXAM: CT HEAD WITHOUT CONTRAST TECHNIQUE: Contiguous axial images were obtained from the base of the skull through the vertex without intravenous contrast. RADIATION DOSE REDUCTION: This exam was performed according to the departmental dose-optimization program which includes automated exposure control, adjustment of the mA and/or kV according to patient size and/or use of iterative reconstruction technique. COMPARISON:  Comparison made with prior studies from earlier the same day. FINDINGS: Brain: Previously identified small hyperdense focus involving the left parietal convexity again seen, relatively stable from prior (series 2, image 22). Given stability in review of prior MRI, it is postulated that this could reflect a small cavernoma, as this lesion appears to potentially be intra-axial in location on prior MRI. However, a small focus of acute hemorrhage remains difficult to exclude, with continued follow-up warranted. No other  new acute intracranial hemorrhage. Acute left thalamic lacunar infarct again noted, better seen on prior MRI. No other acute large vessel territory infarct. No other mass lesion or midline shift. No hydrocephalus or extra-axial fluid collection. Vascular: Contrast material remains on board from prior CTA. Right frontal DVA again noted. Skull: Scalp soft tissues and calvarium demonstrate no new finding. Sinuses/Orbits: Globes orbital soft tissues within normal limits. Paranasal sinuses are clear. No mastoid effusion. Other: None. IMPRESSION: 1. Relatively stable small hyperdense focus involving the left parietal convexity. Upon review of prior MRI, there is question that this could potentially reflect a small cavernoma, although a small focus of acute hemorrhage remains difficult to exclude. Continued short interval follow-up to evaluate for stability versus resolution of this finding would likely be helpful for further evaluation. 2. Acute left thalamic lacunar infarct, better seen on prior MRI. 3. No other new acute intracranial abnormality. Electronically Signed   By: Virgia Griffins M.D.   On: 03/12/2024 23:32   MR BRAIN WO CONTRAST Result Date: 03/12/2024 CLINICAL DATA:  Transient ischemic attack, stroke-like symptoms starting yesterday evening, stops taking blood pressure medicines in November. EXAM: MRI HEAD WITHOUT CONTRAST TECHNIQUE: Multiplanar, multiecho pulse sequences of the brain and surrounding structures were obtained without intravenous contrast. COMPARISON:  Same day CTA head and neck.  MRI head 07/01/2011. FINDINGS: Brain: 9 mm  focus of restricted diffusion within the left thalamus compatible with acute infarct. No significant associated edema or mass effect. There is additional subtle focus of diffusion signal abnormality within the right thalamus without ADC hypointensity which could reflect a small focus of subacute infarct. Focal susceptibility over the left parietal convexity  corresponding to focus of subarachnoid hemorrhage noted on same day CTA. Branching curvilinear susceptibility in the anterior inferior right frontal lobe corresponding to developmental venous anomaly. Scattered and confluent T2/FLAIR hyperintensity in the periventricular and subcortical white matter with additional signal abnormality in the pons. Remote lacunar infarcts in the bilateral corona radiata, basal ganglia, and thalami with additional small remote infarcts in the bilateral cerebellum. Remote infarct involving the left genu of the corpus callosum. Normal appearance of midline structures. The basilar cisterns are patent. No extra-axial fluid collections. Ventricles: Normal size and configuration of the ventricles. Vascular: Skull base flow voids are visualized. Skull and upper cervical spine: No focal abnormality. Sinuses/Orbits: Orbits are symmetric. Paranasal sinuses are clear. Other: Mastoid air cells are clear. IMPRESSION: Acute infarct in the left thalamus. Additional punctate subacute infarct involving the right thalamus. Focal susceptibility over the left parietal convexity corresponding to subarachnoid hemorrhage noted on CTA. Nonspecific white matter signal abnormality which may reflect moderate chronic microvascular ischemic changes versus sequelae of other vasculopathy. Multiple remote infarcts in the bilateral corona radiata, basal ganglia, and thalami. Additional remote infarct in the left genu of the corpus callosum. Small remote infarcts in the bilateral cerebellum. Right frontal developmental venous anomaly. Findings of acute left flank infarct and left parietal subarachnoid hemorrhage were called by telephone at the time of CTA interpretation on 03/12/2024 at 6:34 pm to provider AMJAD ALI , who verbally acknowledged these results. Electronically Signed   By: Denny Flack M.D.   On: 03/12/2024 19:20   CT ANGIO HEAD NECK W WO CM Result Date: 03/12/2024 CLINICAL DATA:  Transient ischemic  attack, EXAM: CT ANGIOGRAPHY HEAD AND NECK WITH AND WITHOUT CONTRAST TECHNIQUE: Multidetector CT imaging of the head and neck was performed using the standard protocol during bolus administration of intravenous contrast. Multiplanar CT image reconstructions and MIPs were obtained to evaluate the vascular anatomy. Carotid stenosis measurements (when applicable) are obtained utilizing NASCET criteria, using the distal internal carotid diameter as the denominator. RADIATION DOSE REDUCTION: This exam was performed according to the departmental dose-optimization program which includes automated exposure control, adjustment of the mA and/or kV according to patient size and/or use of iterative reconstruction technique. CONTRAST:  75mL OMNIPAQUE IOHEXOL 350 MG/ML SOLN COMPARISON:  Same day MRI head. FINDINGS: CT HEAD FINDINGS Brain: Focal subarachnoid hemorrhage over the left parietal convexity involving a single sulcus. No significant mass effect, edema, or midline shift. Additional linear hyperattenuation within the anterior inferior right frontal parenchyma with findings suggestive of developmental venous anomaly on CTA and MRI. The basilar cisterns are patent. Posterior fossa is unremarkable. No evidence of completed large territory infarct. Nonspecific hypoattenuation in the periventricular and subcortical white matter favored to reflect chronic microvascular ischemic changes. Ventricles: Ventricles are normal in size and configuration. Vascular: No hyperdense vessel. Skull: No acute or aggressive finding. Sinuses/orbits: The visualized paranasal sinuses are clear. Orbits are symmetric. Other: Mastoid air cells are clear. CTA NECK FINDINGS Aortic arch: Standard configuration of the aortic arch. Imaged portion shows no evidence of aneurysm or dissection. Calcified and noncalcified atherosclerotic plaque along the visualized aortic arch extending into the proximal descending thoracic aorta. No significant stenosis of the  major arch vessel origins.  Pulmonary arteries: As permitted by contrast timing, there are no filling defects in the visualized pulmonary arteries. Subclavian arteries: Patent bilaterally. Noncalcified atherosclerotic plaque involving the origin of the left subclavian artery without significant stenosis. Right carotid system: No evidence of dissection, stenosis (50% or greater), or occlusion. There is mild irregularity of the proximal cervical ICA without significant narrowing. Left carotid system: No evidence of dissection, stenosis (50% or greater), or occlusion. Mild atherosclerosis along the proximal cervical ICA. There is also possible mild circumferential wall thickening along the proximal cervical ICA. Vertebral arteries: Left vertebral artery is dominant. Severe stenosis and possible focal occlusion at the origin of the right vertebral artery. There is reconstitution of the right V1 segment immediately distal to the origin. The left vertebral artery is patent from the origin to the vertebrobasilar confluence. There is irregularity and mild narrowing of the left V4 segment. No evidence of dissection. Skeleton: No acute findings. Degenerative changes in the cervical spine. Disc space narrowing is most pronounced at C5-6. Edentulous maxilla and mandible. Other neck: The visualized airway is patent. No cervical lymphadenopathy. Upper chest: Emphysema within the visualized lungs. Review of the MIP images confirms the above findings CTA HEAD FINDINGS ANTERIOR CIRCULATION: The intracranial ICAs are patent bilaterally. Mild atherosclerosis of the bilateral carotid siphons without significant stenosis. There is additional mild stenosis at the posterior vertical portion of the right cavernous ICA without definite atherosclerotic plaque. Mild irregularity of the bilateral cavernous ICAs. No high-grade stenosis, proximal occlusion, aneurysm, or vascular malformation. MCAs: The middle cerebral arteries are patent  bilaterally. ACAs: The anterior cerebral arteries are patent bilaterally. Moderate stenosis of the distal A2 segment left ACA. POSTERIOR CIRCULATION: No significant stenosis, proximal occlusion, aneurysm, or vascular malformation. PCAs: The posterior cerebral arteries are patent bilaterally. Pcomm: Not well visualized. SCAs: The superior cerebellar arteries are patent bilaterally. Basilar artery: Patent AICAs: Not well visualized. PICAs: Patent Vertebral arteries: As above. Venous sinuses: As permitted by contrast timing, patent. Anatomic variants: None Review of the MIP images confirms the above findings IMPRESSION: Focal acute subarachnoid hemorrhage over the left parietal convexity involving a single sulcus. No edema, mass effect, or midline shift. Occlusion at the origin of the right vertebral artery with immediate reconstitution of the proximal right V1 segment. This may be due to noncalcified atherosclerotic plaque at the vessel origin. However, there are multiple areas of mild vascular irregularity in the head and neck as detailed above which could reflect vasculitis. Arterial vasculature in the head and neck is otherwise patent. Few scattered areas of mild calcified atherosclerosis. Focal moderate stenosis of the distal A2 segment left ACA. 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 Emphysema. These results were called by telephone at the time of interpretation on 03/12/2024 at 6:34 pm to provider AMJAD ALI , who verbally acknowledged these results. Electronically Signed   By: Denny Flack M.D.   On: 03/12/2024 18:35   DG Chest Portable 1 View Result Date: 03/12/2024 CLINICAL DATA:  Chest pain EXAM: PORTABLE CHEST 1 VIEW COMPARISON:  11/09/2020 FINDINGS: The cardiac silhouette, mediastinal and hilar contours are within normal limits. No acute pulmonary findings. No infiltrates, edema or effusions. The bony thorax is intact. IMPRESSION: No acute cardiopulmonary findings. Electronically Signed    By: Marrian Siva M.D.   On: 03/12/2024 16:36    Microbiology: No results found for this or any previous visit (from the past 240 hours).   Labs: Basic Metabolic Panel: Recent Labs  Lab 03/12/24 1548 03/13/24 0332  NA  140 139  K 3.9 4.3  CL 107 108  CO2  --  22  GLUCOSE 91 101*  BUN 19 15  CREATININE 1.50* 1.46*  CALCIUM  --  9.0  MG  --  2.1  PHOS  --  3.1   Liver Function Tests: No results for input(s): "AST", "ALT", "ALKPHOS", "BILITOT", "PROT", "ALBUMIN" in the last 168 hours. No results for input(s): "LIPASE", "AMYLASE" in the last 168 hours. No results for input(s): "AMMONIA" in the last 168 hours. CBC: Recent Labs  Lab 03/12/24 1435 03/12/24 1548 03/13/24 0332  WBC 10.0  --  11.0*  NEUTROABS 6.9  --   --   HGB 16.4 16.0 16.0  HCT 48.7 47.0 48.7  MCV 84.7  --  86.2  PLT 348  --  317   Cardiac Enzymes: No results for input(s): "CKTOTAL", "CKMB", "CKMBINDEX", "TROPONINI" in the last 168 hours. BNP: BNP (last 3 results) No results for input(s): "BNP" in the last 8760 hours.  ProBNP (last 3 results) No results for input(s): "PROBNP" in the last 8760 hours.  CBG: Recent Labs  Lab 03/12/24 1424 03/13/24 1645  GLUCAP 96 93    Signed:  Verlie Glisson MD   [facility] 03/14/2024, 1:35 PM

## 2024-03-14 NOTE — Progress Notes (Signed)
 STROKE TEAM PROGRESS NOTE   SUBJECTIVE (INTERVAL HISTORY) Wife is at the bedside. Pt neuro stable and no acute event overnight.  Patient BP still on the higher end, currently on amlodipine, lisinopril and metoprolol.  Smoking cessation education again provided.   OBJECTIVE Temp:  [97.8 F (36.6 C)-98.3 F (36.8 C)] 98.3 F (36.8 C) (05/13 1114) Pulse Rate:  [60-72] 72 (05/13 1114) Cardiac Rhythm: Normal sinus rhythm (05/13 0849) Resp:  [17-20] 20 (05/13 1114) BP: (162-188)/(63-88) 165/77 (05/13 1114) SpO2:  [95 %-98 %] 98 % (05/13 1114)  Recent Labs  Lab 03/12/24 1424 03/13/24 1645  GLUCAP 96 93   Recent Labs  Lab 03/12/24 1548 03/13/24 0332  NA 140 139  K 3.9 4.3  CL 107 108  CO2  --  22  GLUCOSE 91 101*  BUN 19 15  CREATININE 1.50* 1.46*  CALCIUM  --  9.0  MG  --  2.1  PHOS  --  3.1   No results for input(s): "AST", "ALT", "ALKPHOS", "BILITOT", "PROT", "ALBUMIN" in the last 168 hours. Recent Labs  Lab 03/12/24 1435 03/12/24 1548 03/13/24 0332  WBC 10.0  --  11.0*  NEUTROABS 6.9  --   --   HGB 16.4 16.0 16.0  HCT 48.7 47.0 48.7  MCV 84.7  --  86.2  PLT 348  --  317   No results for input(s): "CKTOTAL", "CKMB", "CKMBINDEX", "TROPONINI" in the last 168 hours. No results for input(s): "LABPROT", "INR" in the last 72 hours. No results for input(s): "COLORURINE", "LABSPEC", "PHURINE", "GLUCOSEU", "HGBUR", "BILIRUBINUR", "KETONESUR", "PROTEINUR", "UROBILINOGEN", "NITRITE", "LEUKOCYTESUR" in the last 72 hours.  Invalid input(s): "APPERANCEUR"     Component Value Date/Time   CHOL 246 (H) 03/13/2024 0332   TRIG 179 (H) 03/13/2024 0332   HDL 33 (L) 03/13/2024 0332   CHOLHDL 7.5 03/13/2024 0332   VLDL 36 03/13/2024 0332   LDLCALC 177 (H) 03/13/2024 0332   Lab Results  Component Value Date   HGBA1C 5.4 03/13/2024      Component Value Date/Time   LABOPIA NONE DETECTED 03/12/2024 1433   COCAINSCRNUR NONE DETECTED 03/12/2024 1433   LABBENZ NONE DETECTED  03/12/2024 1433   AMPHETMU NONE DETECTED 03/12/2024 1433   THCU NONE DETECTED 03/12/2024 1433   LABBARB NONE DETECTED 03/12/2024 1433    Recent Labs  Lab 03/12/24 1426  ETH <15    I have personally reviewed the radiological images below and agree with the radiology interpretations.  ECHOCARDIOGRAM COMPLETE Result Date: 03/13/2024    ECHOCARDIOGRAM REPORT   Patient Name:   Sean Reilly Date of Exam: 03/13/2024 Medical Rec #:  [id number]       Height:       68.0 in Accession #:    [id number]      Weight:       190.0 lb Date of Birth:  [birth year]        BSA:          2.000 m Patient Age:    67 years        BP:           179/98 mmHg Patient Gender: M               HR:           60 bpm. Exam Location:  Inpatient Procedure: 2D Echo (Both Spectral and Color Flow Doppler were utilized during            procedure). Indications:    stroke  History:        Patient has no prior history of Echocardiogram examinations.                 Chronic kidney disease; Risk Factors:Current Smoker,                 Hypertension and Dyslipidemia.  Sonographer:    Dione Franks RDCS Referring Phys: [id number] Arnulfo Larch  Sonographer Comments: Image acquisition challenging due to respiratory motion. IMPRESSIONS  1. Left ventricular ejection fraction, by estimation, is 55 to 60%. The left ventricle has normal function. The left ventricle has no regional wall motion abnormalities. Left ventricular diastolic parameters are consistent with Grade I diastolic dysfunction (impaired relaxation).  2. Right ventricular systolic function is normal. The right ventricular size is normal. Tricuspid regurgitation signal is inadequate for assessing PA pressure.  3. The mitral valve is normal in structure. No evidence of mitral valve regurgitation. No evidence of mitral stenosis.  4. The aortic valve is tricuspid. Aortic valve regurgitation is not visualized. No aortic stenosis is present.  5. The inferior vena cava is normal in size with  greater than 50% respiratory variability, suggesting right atrial pressure of 3 mmHg. FINDINGS  Left Ventricle: Left ventricular ejection fraction, by estimation, is 55 to 60%. The left ventricle has normal function. The left ventricle has no regional wall motion abnormalities. The left ventricular internal cavity size was normal in size. There is  no left ventricular hypertrophy. Left ventricular diastolic parameters are consistent with Grade I diastolic dysfunction (impaired relaxation). Right Ventricle: The right ventricular size is normal. No increase in right ventricular wall thickness. Right ventricular systolic function is normal. Tricuspid regurgitation signal is inadequate for assessing PA pressure. Left Atrium: Left atrial size was normal in size. Right Atrium: Right atrial size was normal in size. Pericardium: There is no evidence of pericardial effusion. Mitral Valve: The mitral valve is normal in structure. No evidence of mitral valve regurgitation. No evidence of mitral valve stenosis. Tricuspid Valve: The tricuspid valve is normal in structure. Tricuspid valve regurgitation is trivial. Aortic Valve: The aortic valve is tricuspid. Aortic valve regurgitation is not visualized. No aortic stenosis is present. Pulmonic Valve: The pulmonic valve was normal in structure. Pulmonic valve regurgitation is not visualized. Aorta: The aortic root is normal in size and structure. Venous: The inferior vena cava is normal in size with greater than 50% respiratory variability, suggesting right atrial pressure of 3 mmHg. IAS/Shunts: No atrial level shunt detected by color flow Doppler.  LEFT VENTRICLE PLAX 2D LVIDd:         4.90 cm   Diastology LVIDs:         3.30 cm   LV e' medial:    3.81 cm/s LV PW:         1.30 cm   LV E/e' medial:  16.2 LV IVS:        1.00 cm   LV e' lateral:   5.87 cm/s LVOT diam:     2.10 cm   LV E/e' lateral: 10.5 LV SV:         72 LV SV Index:   36 LVOT Area:     3.46 cm  RIGHT VENTRICLE              IVC RV Basal diam:  2.30 cm     IVC diam: 1.40 cm RV S prime:     10.90 cm/s TAPSE (M-mode): 1.4 cm LEFT ATRIUM  Index        RIGHT ATRIUM           Index LA diam:        3.70 cm 1.85 cm/m   RA Area:     14.40 cm LA Vol (A2C):   37.2 ml 18.60 ml/m  RA Volume:   34.30 ml  17.15 ml/m LA Vol (A4C):   46.5 ml 23.25 ml/m LA Biplane Vol: 42.3 ml 21.15 ml/m  AORTIC VALVE LVOT Vmax:   85.20 cm/s LVOT Vmean:  57.500 cm/s LVOT VTI:    0.209 m  AORTA Ao Root diam: 3.60 cm MITRAL VALVE MV Area (PHT): 2.54 cm     SHUNTS MV Decel Time: 299 msec     Systemic VTI:  0.21 m MV E velocity: 61.60 cm/s   Systemic Diam: 2.10 cm MV A velocity: 107.00 cm/s MV E/A ratio:  0.58 Dalton McleanMD Electronically signed by Archer Bear Signature Date/Time: 03/13/2024/6:02:31 PM    Final    CT HEAD WO CONTRAST ( ) Result Date: 03/13/2024 CLINICAL DATA:  Follow-up subdural hematoma EXAM: CT HEAD WITHOUT CONTRAST TECHNIQUE: Contiguous axial images were obtained from the base of the skull through the vertex without intravenous contrast. RADIATION DOSE REDUCTION: This exam was performed according to the departmental dose-optimization program which includes automated exposure control, adjustment of the mA and/or kV according to patient size and/or use of iterative reconstruction technique. 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 Tubular high-density at the right frontal lobe attributed to developmental venous anomaly, stable. 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 Chronic lacunes at the corpus callosum and right caudate. No acute hemorrhage, hydrocephalus, or mass. Vascular: No hyperdense vessel or unexpected calcification. Skull: Normal. Negative for fracture or focal lesion. Sinuses/Orbits: No acute finding. 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 No interval evolution at the acute left thalamic lacunar infarct. Electronically Signed   By: Ronnette Coke M.D.   On: 03/13/2024 06:43   CT Head Wo Contrast Result Date: 03/12/2024 CLINICAL DATA:  Follow-up examination for TIA. EXAM: CT HEAD WITHOUT CONTRAST TECHNIQUE: Contiguous axial images were obtained from the base of the skull through the vertex without intravenous contrast. RADIATION DOSE REDUCTION: This exam was performed according to the departmental dose-optimization program which includes automated exposure control, adjustment of the mA and/or kV according to patient size and/or use of iterative reconstruction technique. COMPARISON:  Comparison made with prior studies from earlier the same day. FINDINGS: Brain: Previously identified small hyperdense focus involving the left parietal convexity again seen, relatively stable from prior (series 2, image 22). Given stability in review of prior MRI, it is postulated that this could reflect a small cavernoma, as this lesion appears to potentially be intra-axial in location on prior MRI. However, a small focus of acute hemorrhage remains difficult to exclude, with continued follow-up warranted. No other new acute intracranial hemorrhage. Acute left thalamic lacunar infarct again noted, better seen on prior MRI. No other acute large vessel territory infarct. No other mass lesion or midline shift. No hydrocephalus or extra-axial fluid collection. Vascular: Contrast material remains on board from prior CTA. Right frontal DVA again noted. Skull: Scalp soft tissues and calvarium demonstrate no new finding. Sinuses/Orbits: Globes orbital soft tissues within normal limits. Paranasal sinuses are clear. No mastoid effusion. Other:  None. IMPRESSION: 1. Relatively stable small hyperdense focus involving the left parietal convexity. Upon review of prior MRI, there is question that this could potentially reflect a  small cavernoma, although a small focus of acute hemorrhage remains difficult to exclude. Continued short interval follow-up to evaluate for stability versus resolution of this finding would likely be helpful for further evaluation. 2. Acute left thalamic lacunar infarct, better seen on prior MRI. 3. No other new acute intracranial abnormality. Electronically Signed   By: Virgia Griffins M.D.   On: 03/12/2024 23:32   MR BRAIN WO CONTRAST Result Date: 03/12/2024 CLINICAL DATA:  Transient ischemic attack, stroke-like symptoms starting yesterday evening, stops taking blood pressure medicines in November. EXAM: MRI HEAD WITHOUT CONTRAST TECHNIQUE: Multiplanar, multiecho pulse sequences of the brain and surrounding structures were obtained without intravenous contrast. COMPARISON:  Same day CTA head and neck.  MRI head 07/01/2011. FINDINGS: Brain: 9 mm focus of restricted diffusion within the left thalamus compatible with acute infarct. No significant associated edema or mass effect. There is additional subtle focus of diffusion signal abnormality within the right thalamus without ADC hypointensity which could reflect a small focus of subacute infarct. Focal susceptibility over the left parietal convexity corresponding to focus of subarachnoid hemorrhage noted on same day CTA. Branching curvilinear susceptibility in the anterior inferior right frontal lobe corresponding to developmental venous anomaly. Scattered and confluent T2/FLAIR hyperintensity in the periventricular and subcortical white matter with additional signal abnormality in the pons. Remote lacunar infarcts in the bilateral corona radiata, basal ganglia, and thalami with additional small remote infarcts in the bilateral cerebellum. Remote infarct involving the left genu of the corpus callosum. Normal appearance of midline structures. The basilar cisterns are patent. No extra-axial fluid collections. Ventricles: Normal size and configuration of the  ventricles. Vascular: Skull base flow voids are visualized. Skull and upper cervical spine: No focal abnormality. Sinuses/Orbits: Orbits are symmetric. Paranasal sinuses are clear. Other: Mastoid air cells are clear. IMPRESSION: Acute infarct in the left thalamus. Additional punctate subacute infarct involving the right thalamus. Focal susceptibility over the left parietal convexity corresponding to subarachnoid hemorrhage noted on CTA. Nonspecific white matter signal abnormality which may reflect moderate chronic microvascular ischemic changes versus sequelae of other vasculopathy. Multiple remote infarcts in the bilateral corona radiata, basal ganglia, and thalami. Additional remote infarct in the left genu of the corpus callosum. Small remote infarcts in the bilateral cerebellum. Right frontal developmental venous anomaly. Findings of acute left flank infarct and left parietal subarachnoid hemorrhage were called by telephone at the time of CTA interpretation on 03/12/2024 at 6:34 pm to provider AMJAD ALI , who verbally acknowledged these results. Electronically Signed   By: Denny Flack M.D.   On: 03/12/2024 19:20   CT ANGIO HEAD NECK W WO CM Result Date: 03/12/2024 CLINICAL DATA:  Transient ischemic attack, EXAM: CT ANGIOGRAPHY HEAD AND NECK WITH AND WITHOUT CONTRAST TECHNIQUE: Multidetector CT imaging of the head and neck was performed using the standard protocol during bolus administration of intravenous contrast. Multiplanar CT image reconstructions and MIPs were obtained to evaluate the vascular anatomy. Carotid stenosis measurements (when applicable) are obtained utilizing NASCET criteria, using the distal internal carotid diameter as the denominator. RADIATION DOSE REDUCTION: This exam was performed according to the departmental dose-optimization program which includes automated exposure control, adjustment of the mA and/or kV according to patient size and/or use of iterative reconstruction technique.  CONTRAST:  75mL OMNIPAQUE IOHEXOL 350 MG/ML SOLN COMPARISON:  Same day MRI head. FINDINGS: CT  HEAD FINDINGS Brain: Focal subarachnoid hemorrhage over the left parietal convexity involving a single sulcus. No significant mass effect, edema, or midline shift. Additional linear hyperattenuation within the anterior inferior right frontal parenchyma with findings suggestive of developmental venous anomaly on CTA and MRI. The basilar cisterns are patent. Posterior fossa is unremarkable. No evidence of completed large territory infarct. Nonspecific hypoattenuation in the periventricular and subcortical white matter favored to reflect chronic microvascular ischemic changes. Ventricles: Ventricles are normal in size and configuration. Vascular: No hyperdense vessel. Skull: No acute or aggressive finding. Sinuses/orbits: The visualized paranasal sinuses are clear. Orbits are symmetric. Other: Mastoid air cells are clear. CTA NECK FINDINGS Aortic arch: Standard configuration of the aortic arch. Imaged portion shows no evidence of aneurysm or dissection. Calcified and noncalcified atherosclerotic plaque along the visualized aortic arch extending into the proximal descending thoracic aorta. No significant stenosis of the major arch vessel origins. Pulmonary arteries: As permitted by contrast timing, there are no filling defects in the visualized pulmonary arteries. Subclavian arteries: Patent bilaterally. Noncalcified atherosclerotic plaque involving the origin of the left subclavian artery without significant stenosis. Right carotid system: No evidence of dissection, stenosis (50% or greater), or occlusion. There is mild irregularity of the proximal cervical ICA without significant narrowing. Left carotid system: No evidence of dissection, stenosis (50% or greater), or occlusion. Mild atherosclerosis along the proximal cervical ICA. There is also possible mild circumferential wall thickening along the proximal cervical ICA.  Vertebral arteries: Left vertebral artery is dominant. Severe stenosis and possible focal occlusion at the origin of the right vertebral artery. There is reconstitution of the right V1 segment immediately distal to the origin. The left vertebral artery is patent from the origin to the vertebrobasilar confluence. There is irregularity and mild narrowing of the left V4 segment. No evidence of dissection. Skeleton: No acute findings. Degenerative changes in the cervical spine. Disc space narrowing is most pronounced at C5-6. Edentulous maxilla and mandible. Other neck: The visualized airway is patent. No cervical lymphadenopathy. Upper chest: Emphysema within the visualized lungs. Review of the MIP images confirms the above findings CTA HEAD FINDINGS ANTERIOR CIRCULATION: The intracranial ICAs are patent bilaterally. Mild atherosclerosis of the bilateral carotid siphons without significant stenosis. There is additional mild stenosis at the posterior vertical portion of the right cavernous ICA without definite atherosclerotic plaque. Mild irregularity of the bilateral cavernous ICAs. No high-grade stenosis, proximal occlusion, aneurysm, or vascular malformation. MCAs: The middle cerebral arteries are patent bilaterally. ACAs: The anterior cerebral arteries are patent bilaterally. Moderate stenosis of the distal A2 segment left ACA. POSTERIOR CIRCULATION: No significant stenosis, proximal occlusion, aneurysm, or vascular malformation. PCAs: The posterior cerebral arteries are patent bilaterally. Pcomm: Not well visualized. SCAs: The superior cerebellar arteries are patent bilaterally. Basilar artery: Patent AICAs: Not well visualized. PICAs: Patent Vertebral arteries: As above. Venous sinuses: As permitted by contrast timing, patent. Anatomic variants: None Review of the MIP images confirms the above findings IMPRESSION: Focal acute subarachnoid hemorrhage over the left parietal convexity involving a single sulcus. No  edema, mass effect, or midline shift. Occlusion at the origin of the right vertebral artery with immediate reconstitution of the proximal right V1 segment. This may be due to noncalcified atherosclerotic plaque at the vessel origin. However, there are multiple areas of mild vascular irregularity in the head and neck as detailed above which could reflect vasculitis. Arterial vasculature in the head and neck is otherwise patent. Few scattered areas of mild calcified atherosclerosis. Focal moderate stenosis of the  distal A2 segment left ACA. 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 Emphysema. These results were called by telephone at the time of interpretation on 03/12/2024 at 6:34 pm to provider AMJAD ALI , who verbally acknowledged these results. Electronically Signed   By: Denny Flack M.D.   On: 03/12/2024 18:35   DG Chest Portable 1 View Result Date: 03/12/2024 CLINICAL DATA:  Chest pain EXAM: PORTABLE CHEST 1 VIEW COMPARISON:  11/09/2020 FINDINGS: The cardiac silhouette, mediastinal and hilar contours are within normal limits. No acute pulmonary findings. No infiltrates, edema or effusions. The bony thorax is intact. IMPRESSION: No acute cardiopulmonary findings. Electronically Signed   By: Marrian Siva M.D.   On: 03/12/2024 16:36     PHYSICAL EXAM  Temp:  [97.8 F (36.6 C)-98.3 F (36.8 C)] 98.3 F (36.8 C) (05/13 1114) Pulse Rate:  [60-72] 72 (05/13 1114) Resp:  [17-20] 20 (05/13 1114) BP: (162-188)/(63-88) 165/77 (05/13 1114) SpO2:  [95 %-98 %] 98 % (05/13 1114)  General - Well nourished, well developed, in no apparent distress.  Ophthalmologic - fundi not visualized due to noncooperation.  Cardiovascular - Regular rhythm and rate.  Mental Status -  Level of arousal and orientation to time, place, and person were intact. Language including expression, naming, repetition, comprehension was assessed and found intact.  Fund of Knowledge was assessed and was  intact.  Cranial Nerves II - XII - II - Visual field intact OU. III, IV, VI - Extraocular movements intact. V - Facial sensation decreased on the right face VII - Facial movement intact bilaterally. VIII - Hearing & vestibular intact bilaterally. X - Palate elevates symmetrically. XI - Chin turning & shoulder shrug intact bilaterally. XII - Tongue protrusion intact.  Motor Strength - The patient's strength was normal in all extremities and pronator drift was absent.  Bulk was normal and fasciculations were absent.   Motor Tone - Muscle tone was assessed at the neck and appendages and was normal.  Reflexes - The patient's reflexes were symmetrical in all extremities and he had no pathological reflexes.  Sensory - Light touch, temperature/pinprick were assessed and were symmetrical.    Coordination - The patient had normal movements in the hands and feet with no ataxia or dysmetria.  Tremor was absent.  Gait and Station - deferred.   ASSESSMENT/PLAN Mr. Philbert Bar is a 68 y.o. male with history of hypertension and smoker admitted for right-sided numbness, headache and significantly elevated BP. No TNK given due to outside window.    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  No DAPT given cavernoma bleeding.  Continue aspirin on discharge Patient counseled to be compliant with his antithrombotic medications Ongoing aggressive stroke risk factor management Therapy recommendations: Outpatient PT Disposition: Home today  Left parietal small ICH from cavernoma and high BP CT and MRI showed left parietal small ICH CT repeat  stable CT head and neck showed right frontal DVA and 2012 brain MRI showed left parietal cavernoma BP goal less than 160 On aspirin 81, no DAPT  Hypertensive urgency Stable on the high end Resume home amlodipine and lisinopril Add metoprolol Long term BP goal less than  160 Long-term BP goal normotensive  Hyperlipidemia Home meds: Lipitor 10 LDL 177, goal < 70 Now on Crestor 40 Continue statin at discharge  Tobacco abuse Current smoker Smoking cessation counseling provided Nicotine patch provided Pt is willing to quit  Other Stroke Risk Factors Advanced age  Other Active Problems Leukocytosis WBC 10.0--11.0 AKI versus CKD, creatinine 1.50--1.46  Hospital day # 1  Neurology will sign off. Please call with questions. Pt will follow up with stroke clinic NP at [facility] in about 4 weeks. Thanks for the consult.   Consuelo Denmark, MD PhD Stroke Neurology 03/14/2024 4:54 PM    To contact Stroke Continuity provider, please refer to [email or web address]. After hours, contact General Neurology

## 2024-03-14 NOTE — TOC Transition Note (Signed)
 Transition of Care [facility]) - Discharge Note   Patient Details  Name: Sean Reilly MRN: [id number] Date of Birth: 1956/01/09  [facility] CM/SW Contact:  Jonathan Neighbor, RN Phone Number: 03/14/2024, 11:10 AM   Clinical Narrative:     Pt is discharging home with outpatient therapy through [facility]. Information on the AVS. [facility] will contact him for the first appointment. Cane recommended. Pt states he will obtain a cane outside the hospital setting.  Pt lives with his spouse. She is able to provide needed supervision. Pt denies issues with transportation or home medications. Pt does have PCP and knows to schedule a follow up appointment after d/c.   Final next level of care: OP Rehab Barriers to Discharge: No Barriers Identified   Patient Goals and CMS Choice            Discharge Placement                       Discharge Plan and Services Additional resources added to the After Visit Summary for                                       Social Drivers of Health (SDOH) Interventions SDOH Screenings   Food Insecurity: No Food Insecurity (03/13/2024)  Housing: Low Risk  (03/13/2024)  Transportation Needs: No Transportation Needs (03/13/2024)  Utilities: Not At Risk (03/13/2024)  Social Connections: Moderately Isolated (03/13/2024)  Tobacco Use: High Risk (03/12/2024)     Readmission Risk Interventions     No data to display

## 2024-03-14 NOTE — Progress Notes (Signed)
 Physical Therapy Treatment Patient Details Name: Sean Reilly MRN: [id number] DOB: 21-Dec-1955 Today's Date: 03/14/2024   History of Present Illness 68 y.o. male presents to [facility] hospital on 03/12/2024 with sudden onset R numbness and dysarthria. CT head reveals acute infarct in L thalamus, subacute infarct in R thalamus. CTA notable for [facility] over the L parietal convexity. PMH includes HTN, HLD, CKD III.    PT Comments  Patient is agreeable to PT session. He is eager to go home today if possible. Patient walked in the hallway with only mild unsteadiness with head turns. Otherwise supervision provided with no loss of balance with acceleration, navigation around obstacles, and with turns. Stairs completed without difficulty. PT will continue to follow to maximize independence and facilitate return to prior level of function.    If plan is discharge home, recommend the following: A little help with walking and/or transfers;A little help with bathing/dressing/bathroom;Assistance with cooking/housework;Assist for transportation;Help with stairs or ramp for entrance   Can travel by private vehicle        Equipment Recommendations  None recommended by PT    Recommendations for Other Services       Precautions / Restrictions Precautions Precautions: Fall Recall of Precautions/Restrictions: Intact Restrictions Weight Bearing Restrictions Per Provider Order: No     Mobility  Bed Mobility Overal bed mobility: Independent                  Transfers Overall transfer level: Needs assistance Equipment used: None Transfers: Sit to/from Stand Sit to Stand: Supervision           General transfer comment: no loss of balance    Ambulation/Gait Ambulation/Gait assistance: Contact guard assist, Supervision Gait Distance (Feet): 200 Feet Assistive device: None Gait Pattern/deviations: Step-through pattern Gait velocity: decreased     General Gait Details: mild unsteadiness when  challenged with head turns while walking with CGA provided. otherwise, supervision for safety including with turns, navigating around obstacles, acceleration.   Stairs Stairs: Yes Stairs assistance: Modified independent (Device/Increase time) Stair Management: Two rails, Alternating pattern, Forwards Number of Stairs: 2 General stair comments: patient went up and down 2 steps x 2 bouts with no physical assistance required.   Wheelchair Mobility     Tilt Bed    Modified Rankin (Stroke Patients Only)       Balance Overall balance assessment: Needs assistance Sitting-balance support: No upper extremity supported, Feet supported Sitting balance-Leahy Scale: Good     Standing balance support: No upper extremity supported, During functional activity Standing balance-Leahy Scale: Fair                              Hotel manager: No apparent difficulties  Cognition Arousal: Alert Behavior During Therapy: WFL for tasks assessed/performed   PT - Cognitive impairments: No apparent impairments                         Following commands: Intact      Cueing Cueing Techniques: Verbal cues  Exercises      General Comments        Pertinent Vitals/Pain Pain Assessment Pain Assessment: No/denies pain    Home Living                          Prior Function            PT Goals (current goals  can now be found in the care plan section) Acute Rehab PT Goals Patient Stated Goal: to return to independence PT Goal Formulation: With patient Time For Goal Achievement: 03/27/24 Potential to Achieve Goals: Good Progress towards PT goals: Progressing toward goals    Frequency    Min 3X/week      PT Plan      Co-evaluation              AM-PAC PT "6 Clicks" Mobility   Outcome Measure  Help needed turning from your back to your side while in a flat bed without using bedrails?: None Help needed moving  from lying on your back to sitting on the side of a flat bed without using bedrails?: None Help needed moving to and from a bed to a chair (including a wheelchair)?: A Little Help needed standing up from a chair using your arms (e.g., wheelchair or bedside chair)?: A Little Help needed to walk in hospital room?: A Little Help needed climbing 3-5 steps with a railing? : A Little 6 Click Score: 20    End of Session   Activity Tolerance: Patient tolerated treatment well Patient left: in bed;with call bell/phone within reach Nurse Communication: Mobility status PT Visit Diagnosis: Other abnormalities of gait and mobility (R26.89);Other symptoms and signs involving the nervous system (R29.898)     Time: 1610-9604 PT Time Calculation (min) (ACUTE ONLY): 15 min  Charges:    $Therapeutic Activity: 8-22 mins PT General Charges $$ ACUTE PT VISIT: 1 Visit                     Ozie Bo, PT, MPT    Erlene Hawks 03/14/2024, 11:18 AM

## 2024-03-14 NOTE — Progress Notes (Signed)
 Discharge instructions, RX's and follow up appts explained and provided to patient and wife by Discharge nurse  [name]. Patient left floor via wheelchair accompanied by staff. No c/o pain or shortness of breath at d/c.  Sean Reilly, Cammie Cellar, RN

## 2024-03-16 ENCOUNTER — Ambulatory Visit: Attending: Neurology

## 2024-03-16 DIAGNOSIS — M6281 Muscle weakness (generalized): Secondary | ICD-10-CM | POA: Diagnosis present

## 2024-03-16 DIAGNOSIS — R29898 Other symptoms and signs involving the musculoskeletal system: Secondary | ICD-10-CM | POA: Insufficient documentation

## 2024-03-16 DIAGNOSIS — I639 Cerebral infarction, unspecified: Secondary | ICD-10-CM | POA: Diagnosis not present

## 2024-03-16 DIAGNOSIS — R29810 Facial weakness: Secondary | ICD-10-CM | POA: Diagnosis present

## 2024-03-16 DIAGNOSIS — R262 Difficulty in walking, not elsewhere classified: Secondary | ICD-10-CM | POA: Diagnosis present

## 2024-03-16 NOTE — Therapy (Signed)
 OUTPATIENT PHYSICAL THERAPY NEURO EVALUATION   Patient Name: Sean Reilly MRN: [id number] DOB:06-Feb-1956, 68 y.o., male Today's Date: 03/16/2024   PCP: Pt states he has one, but does not know her full name, but her first name is Corbin Dess REFERRING PROVIDER: Consuelo Denmark, MD  END OF SESSION:  PT End of Session - 03/16/24 1152     Visit Number 1    Number of Visits 25    Date for PT Re-Evaluation 06/08/24    PT Start Time 1149    PT Stop Time 1230    PT Time Calculation (min) 41 min    Equipment Utilized During Treatment Gait belt    Activity Tolerance Patient tolerated treatment well    Behavior During Therapy WFL for tasks assessed/performed             Past Medical History:  Diagnosis Date   Hypertension    History reviewed. No pertinent surgical history. Patient Active Problem List   Diagnosis Date Noted   Acute ischemic stroke (HCC) 03/13/2024    ONSET DATE: 03/11/24  REFERRING DIAG: I63.9 (ICD-10-CM) - Cerebrovascular accident (CVA), unspecified mechanism (HCC)   THERAPY DIAG:  Right leg weakness  Weakness of right upper extremity  Facial weakness  Difficulty in walking, not elsewhere classified  Muscle weakness (generalized)  Rationale for Evaluation and Treatment: Rehabilitation  SUBJECTIVE:                                                                                                                                                                                             SUBJECTIVE STATEMENT: Pt reports having a stroke on Saturday, and was admitted to the hospital.  Pt reports doing well since then, especially yesterday, noting he felt as though nothing was wrong with him.  This morning however, he woke up just feeling "blah".  Pt notes that he woke up this morning to get his boys up out of bed, that he's raising with his wife.  They are 12 and 14.  Pt reports he went to take a shower this morning and it was a chore to do so.  Pt feeling generalized  weakness and has been stumbling more than normal.  Pt reports he feels more numbness on the R side of the body, some in the LE, mostly in the R hands and in the R side of the face.  Pt note she has some difficulty with drooling and "I've run out of jokes about it". Pt accompanied by: significant other  PERTINENT HISTORY:  68 y.o. male presents to [facility] hospital on 03/12/2024 with sudden onset R numbness and dysarthria. CT head  reveals acute infarct in L thalamus, subacute infarct in R thalamus. CTA notable for [facility] over the L parietal convexity. PMH includes HTN, HLD, CKD III.   PAIN:  Are you having pain? No  PRECAUTIONS: None  RED FLAGS: None   WEIGHT BEARING RESTRICTIONS: No  FALLS: Has patient fallen in last 6 months? No  LIVING ENVIRONMENT: Lives with: lives with their family Lives in: Mobile home Stairs: Yes: External: 5 steps; can reach both Has following equipment at home: Single point cane, Walker - 2 wheeled, and [employer] - 4 wheeled  PLOF: Independent  PATIENT GOALS: Pt just wants to feel "normal" again.    OBJECTIVE:  Note: Objective measures were completed at Evaluation unless otherwise noted.  DIAGNOSTIC FINDINGS:  EXAM: CT HEAD WITHOUT CONTRAST  IMPRESSION: 1. Relatively stable small hyperdense focus involving the left parietal convexity. Upon review of prior MRI, there is question that this could potentially reflect a small cavernoma, although a small focus of acute hemorrhage remains difficult to exclude. Continued short interval follow-up to evaluate for stability versus resolution of this finding would likely be helpful for further evaluation. 2. Acute left thalamic lacunar infarct, better seen on prior MRI. 3. No other new acute intracranial abnormality.  COGNITION: Overall cognitive status: Within functional limits for tasks assessed   SENSATION: Light touch: Impaired on the R side, specifically with C5-C7 noting increased numbness along those  distribution pathways.   COORDINATION: WFL  POSTURE: rounded shoulders    (Blank rows = not tested)  LOWER EXTREMITY MMT:    MMT Right Eval Left Eval  Hip flexion 5 5  Hip extension    Hip abduction 5 5  Hip adduction 5 5  Hip internal rotation    Hip external rotation    Knee flexion 4+ 5  Knee extension 5 5  Ankle dorsiflexion 5 5  Ankle plantarflexion    Ankle inversion    Ankle eversion    (Blank rows = not tested)  STAIRS: Findings: Stair Negotiation Technique: Alternating Pattern  with Bilateral Rails, Number of Stairs: 4, Height of Stairs: 6'   , and Comments: stable with performance  GAIT: Findings: Distance walked: 62' and Comments: Trendelenburg on the R LE, noting some weakness in the R gluteal musculature  FUNCTIONAL TESTS:  5 times sit to stand: 16.45 sec Timed up and go (TUG): 13.19 sec 10 meter walk test: 10.16 sec; 0.98 m/sec Dynamic Gait Index: TBD Functional gait assessment: TBD  PATIENT SURVEYS:  Stroke Impact Scale 82.5% ABC scale 71.9%                                                                                                                              TREATMENT DATE: 03/16/24  TherEx:  Supine bridging with glute squeeze prior to lift off, x10 Sidelying clamshells, x10 on R LE only Sidelying hip abduction, x10 on R LE only Standing hip abduction, x10 on R LE only  PATIENT EDUCATION: Education details: Pt educated on role of PT and services provided during current POC, along with prognosis and information about the clinic. Person educated: Patient and Spouse Education method: Explanation, Demonstration, and Handouts Education comprehension: verbalized understanding, returned demonstration, verbal cues required, and tactile cues required  HOME EXERCISE PROGRAM: Access Code: [email or web address] URL: [email or web address] Date: 03/16/2024 Prepared by: Dawson Europe  Exercises - Supine Bridge  - 1 x daily - 7 x weekly -  3 sets - 10 reps - Clamshell  - 1 x daily - 7 x weekly - 3 sets - 10 reps - Sidelying Hip Abduction  - 1 x daily - 7 x weekly - 3 sets - 10 reps - Standing Hip Abduction  - 1 x daily - 7 x weekly - 3 sets - 10 reps  GOALS: Goals reviewed with patient? Yes  SHORT TERM GOALS: Target date: 06/08/2024  Pt will be independent with HEP in order to demonstrate increased ability to perform tasks related to occupation/hobbies. Baseline: Goal status: INITIAL  LONG TERM GOALS: Target date: 06/08/2024  1.  Patient (> 25 years old) will complete five times sit to stand test in < 15 seconds indicating an increased LE strength and improved balance. Baseline: 16.45 sec Goal status: INITIAL  2.  Patient will increase ABC score to equal to or greater than 10% to demonstrate statistically significant improvement in mobility and quality of life.  Baseline: 71.9% Goal status: INITIAL   3.  Patient will increase DGI score by > 6 points to demonstrate decreased fall risk during functional activities. Baseline: TBD at next session Goal status: INITIAL   4.  Patient will reduce timed up and go to <11 seconds to reduce fall risk and demonstrate improved transfer/gait ability. Baseline: 13.19 sec Goal status: INITIAL  5.  Patient will increase 10 meter walk test to >1.31m/s as to improve gait speed for better community ambulation and to reduce fall risk. Baseline: 10.16 sec; 0.98 m/s Goal status: INITIAL  6.  Patient will increase six minute walk test distance to >1000 for progression to community ambulator and improve gait ability Baseline: TBD at next session Goal status: INITIAL  7.  Patient will increase SIS-16 score to equal to or greater than 10% to demonstrate statistically significant improvement in mobility and quality of life.  Baseline: 82.5% Goal status: INITIAL    ASSESSMENT:  CLINICAL IMPRESSION: Patient is a 68 y.o. male who was seen today for physical therapy evaluation and treatment  s/p CVA affecting the R side of the body.  Pt presents with physical impairments of decreased activity tolerance, and decreased balance, specifically due to the numbness located in the R LE as noted.  Pt also has numbness of the R UE in the hand and the R face as well.  These deficits are the most pressing for the pt at this time due to lack of proprioception and inability to eat without drooling.  Pt  also demonstrates reduced strength in the R LE most notably with walking and experiencing Trendelenburg gait pattern on the R LE while ambulating.  Pt will benefit from skilled therapy to address these issues and return to PLOF.Aaron Aas    OBJECTIVE IMPAIRMENTS: Abnormal gait, decreased activity tolerance, decreased balance, decreased mobility, and decreased strength.   ACTIVITY LIMITATIONS: carrying, lifting, bending, squatting, reach over head, and hygiene/grooming  PARTICIPATION LIMITATIONS: cleaning, driving, shopping, community activity, and yard work  PERSONAL FACTORS: Past/current experiences, Time since onset of injury/illness/exacerbation, and 1-2 comorbidities: smoking, acute  ischemic stroke are also affecting patient's functional outcome.   REHAB POTENTIAL: Good  CLINICAL DECISION MAKING: Evolving/moderate complexity  EVALUATION COMPLEXITY: Moderate  PLAN:  PT FREQUENCY: 1-2x/week  PT DURATION: 12 weeks  PLANNED INTERVENTIONS: 97750- Physical Performance Testing, 97110-Therapeutic exercises, 97530- Therapeutic activity, [id number]- Neuromuscular re-education, 97535- Self Care, 16109- Manual therapy, [phone]- Gait training, Patient/Family education, Balance training, Stair training, and Dry Needling  PLAN FOR NEXT SESSION:  Continue with DGI or FGA Perform 6 MWT Assess compliance with HEP and update as needed.  Assess UE for potential referral to OT?   Rozanna Corner, PT, DPT Physical Therapist - [facility]  03/16/24, 1:09 PM

## 2024-03-21 ENCOUNTER — Ambulatory Visit: Admitting: Physical Therapy

## 2024-03-21 DIAGNOSIS — R262 Difficulty in walking, not elsewhere classified: Secondary | ICD-10-CM

## 2024-03-21 DIAGNOSIS — R29810 Facial weakness: Secondary | ICD-10-CM

## 2024-03-21 DIAGNOSIS — R29898 Other symptoms and signs involving the musculoskeletal system: Secondary | ICD-10-CM | POA: Diagnosis not present

## 2024-03-21 DIAGNOSIS — M6281 Muscle weakness (generalized): Secondary | ICD-10-CM

## 2024-03-21 NOTE — Therapy (Signed)
 OUTPATIENT PHYSICAL THERAPY NEURO EVALUATION   Patient Name: Sean Reilly MRN: [id number] DOB:April 25, 1956, 68 y.o., male Today's Date: 03/21/2024   PCP: Pt states he has one, but does not know her full name, but her first name is Corbin Dess REFERRING PROVIDER: Consuelo Denmark, MD  END OF SESSION:  PT End of Session - 03/21/24 1025     Visit Number 2    Number of Visits 25    Date for PT Re-Evaluation 06/08/24    PT Start Time 1025    PT Stop Time 1100    PT Time Calculation (min) 35 min    Equipment Utilized During Treatment Gait belt    Activity Tolerance Patient tolerated treatment well    Behavior During Therapy WFL for tasks assessed/performed             Past Medical History:  Diagnosis Date   Hypertension    No past surgical history on file. Patient Active Problem List   Diagnosis Date Noted   Acute ischemic stroke (HCC) 03/13/2024    ONSET DATE: 03/11/24  REFERRING DIAG: I63.9 (ICD-10-CM) - Cerebrovascular accident (CVA), unspecified mechanism (HCC)   THERAPY DIAG:  Right leg weakness  Weakness of right upper extremity  Facial weakness  Difficulty in walking, not elsewhere classified  Muscle weakness (generalized)  Rationale for Evaluation and Treatment: Rehabilitation  SUBJECTIVE:                                                                                                                                                                                             SUBJECTIVE STATEMENT:   From today:  Pt reports that he is doing well. Has been busy with "honeydo" list. States that he feels like the R leg is a little weaker still than the L, but has been walking everyday.  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     Pt reports having a stroke on Saturday, and was admitted to the hospital.  Pt reports doing well since then,  especially yesterday, noting he felt as though nothing was wrong with him.  This morning however, he woke up just feeling "blah".  Pt notes that he woke up this morning to get his boys up out of bed, that he's raising with his wife.  They are 12 and 14.  Pt reports he went to take a shower this morning and it was a chore to do so.  Pt feeling generalized weakness and has been stumbling more than normal.  Pt reports  he feels more numbness on the R side of the body, some in the LE, mostly in the R hands and in the R side of the face.  Pt note she has some difficulty with drooling and "I've run out of jokes about it". Pt accompanied by: significant other  PERTINENT HISTORY:  68 y.o. male presents to [facility] hospital on 03/12/2024 with sudden onset R numbness and dysarthria. CT head reveals acute infarct in L thalamus, subacute infarct in R thalamus. CTA notable for [facility] over the L parietal convexity. PMH includes HTN, HLD, CKD III.   PAIN:  Are you having pain? No  PRECAUTIONS: None  RED FLAGS: None   WEIGHT BEARING RESTRICTIONS: No  FALLS: Has patient fallen in last 6 months? No  LIVING ENVIRONMENT: Lives with: lives with their family Lives in: Mobile home Stairs: Yes: External: 5 steps; can reach both Has following equipment at home: Single point cane, Walker - 2 wheeled, and [employer] - 4 wheeled  PLOF: Independent  PATIENT GOALS: Pt just wants to feel "normal" again.    OBJECTIVE:  Note: Objective measures were completed at Evaluation unless otherwise noted.  DIAGNOSTIC FINDINGS:  EXAM: CT HEAD WITHOUT CONTRAST  IMPRESSION: 1. Relatively stable small hyperdense focus involving the left parietal convexity. Upon review of prior MRI, there is question that this could potentially reflect a small cavernoma, although a small focus of acute hemorrhage remains difficult to exclude. Continued short interval follow-up to evaluate for stability versus resolution of this finding would likely be  helpful for further evaluation. 2. Acute left thalamic lacunar infarct, better seen on prior MRI. 3. No other new acute intracranial abnormality.  COGNITION: Overall cognitive status: Within functional limits for tasks assessed   SENSATION: Light touch: Impaired on the R side, specifically with C5-C7 noting increased numbness along those distribution pathways.   COORDINATION: WFL  POSTURE: rounded shoulders    (Blank rows = not tested)  LOWER EXTREMITY MMT:    MMT Right Eval Left Eval  Hip flexion 5 5  Hip extension    Hip abduction 5 5  Hip adduction 5 5  Hip internal rotation    Hip external rotation    Knee flexion 4+ 5  Knee extension 5 5  Ankle dorsiflexion 5 5  Ankle plantarflexion    Ankle inversion    Ankle eversion    (Blank rows = not tested)  STAIRS: Findings: Stair Negotiation Technique: Alternating Pattern  with Bilateral Rails, Number of Stairs: 4, Height of Stairs: 6'   , and Comments: stable with performance  GAIT: Findings: Distance walked: 33' and Comments: Trendelenburg on the R LE, noting some weakness in the R gluteal musculature  FUNCTIONAL TESTS:  5 times sit to stand: 16.45 sec Timed up and go (TUG): 13.19 sec 10 meter walk test: 10.16 sec; 0.98 m/sec Dynamic Gait Index: TBD Functional gait assessment: TBD  PATIENT SURVEYS:  Stroke Impact Scale 82.5% ABC scale 71.9%  TREATMENT DATE: 03/21/24   6 Min Walk Test:  Instructed patient to ambulate as quickly and as safely as possible for 6 minutes using LRAD. Patient was allowed to take standing rest breaks without stopping the test, but if the patient required a sitting rest break the clock would be stopped and the test would be over.  Results: 1315 feet using no AD with distant supervision . Results indicate that the patient has reduced endurance with ambulation  compared to age matched norms. Reports SOB 4/10 upon completion Age Matched Norms: 21-69 yo M: 13 F: 79, 58-79 yo M: 73 F: 471, 70-89 yo M: 417 F: 392 MDC: 58.21 meters (190.98 feet) or 50 meters (ANPTA Core Set of Outcome Measures for Adults with Neurologic Conditions, 2018)   [facility] PT Assessment - 03/21/24 0001       Mini-BESTest   Sit To Stand Normal: Comes to stand without use of hands and stabilizes independently.    Rise to Toes Moderate: Heels up, but not full range (smaller than when holding hands), OR noticeable instability for 3 s.    Stand on one leg (left) Normal: 20 s.    Stand on one leg (right) Normal: 20 s.    Stand on one leg - lowest score 2    Compensatory Stepping Correction - Forward Normal: Recovers independently with a single, large step (second realignement is allowed).    Compensatory Stepping Correction - Backward Normal: Recovers independently with a single, large step    Compensatory Stepping Correction - Left Lateral Moderate: Several steps to recover equilibrium    Compensatory Stepping Correction - Right Lateral Moderate: Several steps to recover equilibrium    Stepping Corredtion Lateral - lowest score 1    Stance - Feet together, eyes open, firm surface  Normal: 30s    Stance - Feet together, eyes closed, foam surface  Normal: 30s   required supervision assist   Incline - Eyes Closed Moderate: Stands independently < 30s OR aligns with surface    Change in Gait Speed Normal: Significantly changes walkling speed without imbalance    Walk with head turns - Horizontal Normal: performs head turns with no change in gait speed and good balance    Walk with pivot turns Normal: Turns with feet close FAST (< 3 steps) with good balance.    Step over obstacles Normal: Able to step over box with minimal change of gait speed and with good balance.    Timed UP & GO with Dual Task Moderate: Dual Task affects either counting OR walking (>10%) when compared to the TUG  without Dual Task.    Mini-BEST total score 24      Functional Gait  Assessment   Gait Level Surface Walks 20 ft in less than 5.5 sec, no assistive devices, good speed, no evidence for imbalance, normal gait pattern, deviates no more than 6 in outside of the 12 in walkway width.    Change in Gait Speed Able to smoothly change walking speed without loss of balance or gait deviation. Deviate no more than 6 in outside of the 12 in walkway width.    Gait with Horizontal Head Turns Performs head turns smoothly with no change in gait. Deviates no more than 6 in outside 12 in walkway width    Gait with Vertical Head Turns Performs head turns with no change in gait. Deviates no more than 6 in outside 12 in walkway width.    Gait and Pivot Turn Pivot turns safely  within 3 sec and stops quickly with no loss of balance.    Step Over Obstacle Is able to step over 2 stacked shoe boxes taped together (9 in total height) without changing gait speed. No evidence of imbalance.    Gait with Narrow Base of Support Is able to ambulate for 10 steps heel to toe with no staggering.    Gait with Eyes Closed Walks 20 ft, no assistive devices, good speed, no evidence of imbalance, normal gait pattern, deviates no more than 6 in outside 12 in walkway width. Ambulates 20 ft in less than 7 sec.    Ambulating Backwards Walks 20 ft, no assistive devices, good speed, no evidence for imbalance, normal gait    Steps Alternating feet, no rail.    Total Score 30            Patient demonstrates increased fall risk as noted by score of 30/30 on  Functional Gait Assessment.   <22/30 = predictive of falls, <20/30 = fall in 6 months, <18/30 = predictive of falls in PD MCID: 5 points stroke population, 4 points geriatric population (ANPTA Core Set of Outcome Measures for Adults with Neurologic Conditions, 2018)  MiniBEST Assesment- Balance Evaluation Systems Test SCORE 24/28  HEP expansion for improved safety with high level  balance tasks to allow return to full PLOF with iADLs.  - Single Leg Balance with Eyes Closed  - 1 x daily - 5 x weekly - 3 sets - 4 reps - 10seconds hold - Tandem Stance with Eyes Closed  - 1 x daily - 5 x weekly - 3 sets - 4 reps - 20 seconds  hold   PATIENT EDUCATION: Education details: Pt educated on role of PT and services provided during current POC, along with prognosis and information about the clinic. Person educated: Patient and Spouse Education method: Explanation, Demonstration, and Handouts Education comprehension: verbalized understanding, returned demonstration, verbal cues required, and tactile cues required  HOME EXERCISE PROGRAM: Access Code: [email or web address] URL: [email or web address] Date: 03/21/2024 Prepared by: Aurora Lees  Exercises - Supine Bridge  - 1 x daily - 7 x weekly - 3 sets - 10 reps - Clamshell  - 1 x daily - 7 x weekly - 3 sets - 10 reps - Sidelying Hip Abduction  - 1 x daily - 7 x weekly - 3 sets - 10 reps - Standing Hip Abduction  - 1 x daily - 7 x weekly - 3 sets - 10 reps - Single Leg Balance with Eyes Closed  - 1 x daily - 5 x weekly - 3 sets - 4 reps - 10seconds hold - Tandem Stance with Eyes Closed  - 1 x daily - 5 x weekly - 3 sets - 4 reps - 20 seconds  hold  GOALS: Goals reviewed with patient? Yes  SHORT TERM GOALS: Target date: 06/08/2024  Pt will be independent with HEP in order to demonstrate increased ability to perform tasks related to occupation/hobbies. Baseline: Goal status: INITIAL  LONG TERM GOALS: Target date: 06/08/2024  1.  Patient (> 11 years old) will complete five times sit to stand test in < 15 seconds indicating an increased LE strength and improved balance. Baseline: 16.45 sec Goal status: INITIAL  2.  Patient will increase ABC score to equal to or greater than 10% to demonstrate statistically significant improvement in mobility and quality of life.  Baseline: 71.9% Goal status: INITIAL   3.  Patient will  increase Minibest  score by > 3  points to demonstrate decreased fall risk during functional activities. Baseline: 24/28 Goal status: INITIAL   4.  Patient will reduce timed up and go to <11 seconds to reduce fall risk and demonstrate improved transfer/gait ability. Baseline: 13.19 sec Goal status: INITIAL  5.  Patient will increase 10 meter walk test to >1.10m/s as to improve gait speed for better community ambulation and to reduce fall risk. Baseline: 10.16 sec; 0.98 m/s Goal status: INITIAL  6.  Patient will increase six minute walk test distance by 152ft  for progression to community ambulator and improve gait ability Baseline:1315 Goal status: adjusted; INITIAL  7.  Patient will increase SIS-16 score to equal to or greater than 10% to demonstrate statistically significant improvement in mobility and quality of life.  Baseline: 82.5% Goal status: INITIAL    ASSESSMENT:  CLINICAL IMPRESSION: Patient is a 68 y.o. male who was seen today for physical therapy evaluation and treatment s/p CVA affecting the R side of the body.  Pt noted to  have reduced Trendelenburg Gait on this day with gait compared to evaluation.  Completed 6 min walk test, 1315, which is reduced for aged matched norms. 30/30 on DGI and 24/28 on minibest, with greatest difficulty on lateral LOB correction and balance with eyes closed. HEP expanded to address balance deficits with eyes closed.  Pt will benefit from skilled therapy to address these issues and return to PLOF.    OBJECTIVE IMPAIRMENTS: Abnormal gait, decreased activity tolerance, decreased balance, decreased mobility, and decreased strength.   ACTIVITY LIMITATIONS: carrying, lifting, bending, squatting, reach over head, and hygiene/grooming  PARTICIPATION LIMITATIONS: cleaning, driving, shopping, community activity, and yard work  PERSONAL FACTORS: Past/current experiences, Time since onset of injury/illness/exacerbation, and 1-2 comorbidities: smoking,  acute ischemic stroke are also affecting patient's functional outcome.   REHAB POTENTIAL: Good  CLINICAL DECISION MAKING: Evolving/moderate complexity  EVALUATION COMPLEXITY: Moderate  PLAN:  PT FREQUENCY: 1-2x/week  PT DURATION: 12 weeks  PLANNED INTERVENTIONS: 97750- Physical Performance Testing, 97110-Therapeutic exercises, 97530- Therapeutic activity, [id number]- Neuromuscular re-education, 97535- Self Care, [id number]- Manual therapy, [phone]- Gait training, Patient/Family education, Balance training, Stair training, and Dry Needling  PLAN FOR NEXT SESSION:    Assess UE for potential referral to OT? High level balance with eyes closed. RLE strengthening as indicated.   Aurora Lees PT, DPT  Physical Therapist - [place]  [facility]  11:22 AM 03/21/24

## 2024-03-23 ENCOUNTER — Ambulatory Visit

## 2024-03-23 DIAGNOSIS — R29810 Facial weakness: Secondary | ICD-10-CM

## 2024-03-23 DIAGNOSIS — R29898 Other symptoms and signs involving the musculoskeletal system: Secondary | ICD-10-CM | POA: Diagnosis not present

## 2024-03-23 DIAGNOSIS — R262 Difficulty in walking, not elsewhere classified: Secondary | ICD-10-CM

## 2024-03-23 DIAGNOSIS — M6281 Muscle weakness (generalized): Secondary | ICD-10-CM

## 2024-03-23 NOTE — Therapy (Signed)
 OUTPATIENT PHYSICAL THERAPY NEURO TREATMENT   Patient Name: Sean Reilly MRN: [id number] DOB:Nov 26, 1955, 68 y.o., male Today's Date: 03/23/2024   PCP: Pt states he has one, but does not know her full name, but her first name is Corbin Dess REFERRING PROVIDER: Consuelo Denmark, MD  END OF SESSION:  PT End of Session - 03/23/24 0807     Visit Number 3    Number of Visits 25    Date for PT Re-Evaluation 06/08/24    PT Start Time 0804    PT Stop Time 0845    PT Time Calculation (min) 41 min    Equipment Utilized During Treatment Gait belt    Activity Tolerance Patient tolerated treatment well    Behavior During Therapy WFL for tasks assessed/performed             Past Medical History:  Diagnosis Date   Hypertension    No past surgical history on file. Patient Active Problem List   Diagnosis Date Noted   Acute ischemic stroke (HCC) 03/13/2024    ONSET DATE: 03/11/24  REFERRING DIAG: I63.9 (ICD-10-CM) - Cerebrovascular accident (CVA), unspecified mechanism (HCC)   THERAPY DIAG:  Right leg weakness  Weakness of right upper extremity  Facial weakness  Difficulty in walking, not elsewhere classified  Muscle weakness (generalized)  Rationale for Evaluation and Treatment: Rehabilitation  SUBJECTIVE:                                                                                                                                                                                             SUBJECTIVE STATEMENT:   Pt reports no complications since last visit and denies any falls or issues since then.  Pt reports he has been using the app to do his exercises.   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    Pt reports having a stroke on Saturday, and was admitted to the hospital.  Pt reports doing well since then, especially yesterday, noting he felt as though  nothing was wrong with him.  This morning however, he woke up just feeling "blah".  Pt notes that he woke up this morning to get his boys up out of bed, that he's raising with his wife.  They are 12 and 14.  Pt reports he went to take a shower this morning and it was a chore to do so.  Pt feeling generalized weakness and has been stumbling more than normal.  Pt reports he feels more numbness on the R side of  the body, some in the LE, mostly in the R hands and in the R side of the face.  Pt note she has some difficulty with drooling and "I've run out of jokes about it". Pt accompanied by: significant other  PERTINENT HISTORY:  69 y.o. male presents to [facility] hospital on 03/12/2024 with sudden onset R numbness and dysarthria. CT head reveals acute infarct in L thalamus, subacute infarct in R thalamus. CTA notable for [facility] over the L parietal convexity. PMH includes HTN, HLD, CKD III.   PAIN:  Are you having pain? No  PRECAUTIONS: None  RED FLAGS: None   WEIGHT BEARING RESTRICTIONS: No  FALLS: Has patient fallen in last 6 months? No  LIVING ENVIRONMENT: Lives with: lives with their family Lives in: Mobile home Stairs: Yes: External: 5 steps; can reach both Has following equipment at home: Single point cane, Walker - 2 wheeled, and [employer] - 4 wheeled  PLOF: Independent  PATIENT GOALS: Pt just wants to feel "normal" again.    OBJECTIVE:  Note: Objective measures were completed at Evaluation unless otherwise noted.  DIAGNOSTIC FINDINGS:  EXAM: CT HEAD WITHOUT CONTRAST  IMPRESSION: 1. Relatively stable small hyperdense focus involving the left parietal convexity. Upon review of prior MRI, there is question that this could potentially reflect a small cavernoma, although a small focus of acute hemorrhage remains difficult to exclude. Continued short interval follow-up to evaluate for stability versus resolution of this finding would likely be helpful for further evaluation. 2. Acute left  thalamic lacunar infarct, better seen on prior MRI. 3. No other new acute intracranial abnormality.  COGNITION: Overall cognitive status: Within functional limits for tasks assessed   SENSATION: Light touch: Impaired on the R side, specifically with C5-C7 noting increased numbness along those distribution pathways.   COORDINATION: WFL  POSTURE: rounded shoulders    (Blank rows = not tested)  LOWER EXTREMITY MMT:    MMT Right Eval Left Eval  Hip flexion 5 5  Hip extension    Hip abduction 5 5  Hip adduction 5 5  Hip internal rotation    Hip external rotation    Knee flexion 4+ 5  Knee extension 5 5  Ankle dorsiflexion 5 5  Ankle plantarflexion    Ankle inversion    Ankle eversion    (Blank rows = not tested)  STAIRS: Findings: Stair Negotiation Technique: Alternating Pattern  with Bilateral Rails, Number of Stairs: 4, Height of Stairs: 6'   , and Comments: stable with performance  GAIT: Findings: Distance walked: 66' and Comments: Trendelenburg on the R LE, noting some weakness in the R gluteal musculature  FUNCTIONAL TESTS:  5 times sit to stand: 16.45 sec Timed up and go (TUG): 13.19 sec 10 meter walk test: 10.16 sec; 0.98 m/sec Dynamic Gait Index: TBD Functional gait assessment: TBD  PATIENT SURVEYS:  Stroke Impact Scale 82.5% ABC scale 71.9%  TREATMENT DATE: 03/23/24  Neuro:  Static stance on Airex pad, 30 sec bouts Static stance on Airex pad, eyes closed 30 sec bouts Static NBOS on Airex pad, 30 sec bouts Static NBOS on Airex pad, eyes closed, 30 sec bouts Static staggered stance on Airex pad, 30 sec bouts Static staggered stance on Airex pad, eyes closed, 30 sec bouts attempted, however able to achieve 15 sec before needing to readjust  Lateral stepping on airex balance beam, x10 down and back Forward ambulation on airex  balance beam, x10 each direction Pt performed one backwards ambulation on the airex balance beam without much difficulty  Tested Grip Strength: R: 102#  L: 96#  Pinch Strength: R: 20# L: 23#    TherAct:  Ambulation for 1400' with 8# AW donned for increased endurance and functional strength necessary for performing tasks around the house    PATIENT EDUCATION: Education details: Pt educated on role of PT and services provided during current POC, along with prognosis and information about the clinic. Person educated: Patient and Spouse Education method: Explanation, Demonstration, and Handouts Education comprehension: verbalized understanding, returned demonstration, verbal cues required, and tactile cues required  HOME EXERCISE PROGRAM: Access Code: [email or web address] URL: [email or web address] Date: 03/21/2024 Prepared by: Aurora Lees  Exercises - Supine Bridge  - 1 x daily - 7 x weekly - 3 sets - 10 reps - Clamshell  - 1 x daily - 7 x weekly - 3 sets - 10 reps - Sidelying Hip Abduction  - 1 x daily - 7 x weekly - 3 sets - 10 reps - Standing Hip Abduction  - 1 x daily - 7 x weekly - 3 sets - 10 reps - Single Leg Balance with Eyes Closed  - 1 x daily - 5 x weekly - 3 sets - 4 reps - 10seconds hold - Tandem Stance with Eyes Closed  - 1 x daily - 5 x weekly - 3 sets - 4 reps - 20 seconds  hold  GOALS: Goals reviewed with patient? Yes  SHORT TERM GOALS: Target date: 06/08/2024  Pt will be independent with HEP in order to demonstrate increased ability to perform tasks related to occupation/hobbies. Baseline: Goal status: INITIAL  LONG TERM GOALS: Target date: 06/08/2024  1.  Patient (> 71 years old) will complete five times sit to stand test in < 15 seconds indicating an increased LE strength and improved balance. Baseline: 16.45 sec Goal status: INITIAL  2.  Patient will increase ABC score to equal to or greater than 10% to demonstrate statistically significant  improvement in mobility and quality of life.  Baseline: 71.9% Goal status: INITIAL   3.  Patient will increase Minibest  score by > 3 points to demonstrate decreased fall risk during functional activities. Baseline: 24/28 Goal status: INITIAL   4.  Patient will reduce timed up and go to <11 seconds to reduce fall risk and demonstrate improved transfer/gait ability. Baseline: 13.19 sec Goal status: INITIAL  5.  Patient will increase 10 meter walk test to >1.93m/s as to improve gait speed for better community ambulation and to reduce fall risk. Baseline: 10.16 sec; 0.98 m/s Goal status: INITIAL  6.  Patient will increase six minute walk test distance by 112ft  for progression to community ambulator and improve gait ability Baseline: 1315' Goal status: adjusted; INITIAL  7.  Patient will increase SIS-16 score to equal to or greater than 10% to demonstrate statistically significant improvement in mobility and quality of life.  Baseline: 82.5% Goal  status: INITIAL    ASSESSMENT:  CLINICAL IMPRESSION:  Pt performed well with the tasks and was able to demonstrate good balance on the airex balance beam wand when asked to do tasks with eyes closed.  Pt also reporting that he is still having difficulty with his endurance levels, so pt was tasked with walking longer distance with 8# AW's to improve overall endurance levels.  Pt noted to be slightly fatigued after the ambulation attempt with the weights.  Pt to continue to improve upon this moving forward.   Pt will continue to benefit from skilled therapy to address remaining deficits in order to improve overall QoL and return to PLOF.      OBJECTIVE IMPAIRMENTS: Abnormal gait, decreased activity tolerance, decreased balance, decreased mobility, and decreased strength.   ACTIVITY LIMITATIONS: carrying, lifting, bending, squatting, reach over head, and hygiene/grooming  PARTICIPATION LIMITATIONS: cleaning, driving, shopping, community  activity, and yard work  PERSONAL FACTORS: Past/current experiences, Time since onset of injury/illness/exacerbation, and 1-2 comorbidities: smoking, acute ischemic stroke are also affecting patient's functional outcome.   REHAB POTENTIAL: Good  CLINICAL DECISION MAKING: Evolving/moderate complexity  EVALUATION COMPLEXITY: Moderate  PLAN:  PT FREQUENCY: 1-2x/week  PT DURATION: 12 weeks  PLANNED INTERVENTIONS: 97750- Physical Performance Testing, 97110-Therapeutic exercises, 97530- Therapeutic activity, [id number]- Neuromuscular re-education, 97535- Self Care, 16109- Manual therapy, [phone]- Gait training, Patient/Family education, Balance training, Stair training, and Dry Needling  PLAN FOR NEXT SESSION:   Assess UE for potential referral to OT? High level balance with eyes closed. RLE strengthening as indicated.     Rozanna Corner, PT, DPT Physical Therapist - [facility]  03/23/24, 9:20 AM

## 2024-03-28 NOTE — Therapy (Incomplete)
 OUTPATIENT PHYSICAL THERAPY NEURO TREATMENT   Patient Name: Sean Reilly MRN: [id number] DOB:10-15-1956, 68 y.o., male Today's Date: 03/28/2024   PCP: Pt states he has one, but does not know her full name, but her first name is Corbin Dess REFERRING PROVIDER: Consuelo Denmark, MD  END OF SESSION:    Past Medical History:  Diagnosis Date   Hypertension    No past surgical history on file. Patient Active Problem List   Diagnosis Date Noted   Acute ischemic stroke (HCC) 03/13/2024    ONSET DATE: 03/11/24  REFERRING DIAG: I63.9 (ICD-10-CM) - Cerebrovascular accident (CVA), unspecified mechanism (HCC)   THERAPY DIAG:  No diagnosis found.  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    Pt reports having a stroke on Saturday, and was admitted to the hospital.  Pt reports doing well since then, especially yesterday, noting he felt as though nothing was wrong with him.  This morning however, he woke up just feeling "blah".  Pt notes that he woke up this morning to get his boys up out of bed, that he's raising with his wife.  They are 12 and 14.  Pt reports he went to take a shower this morning and it was a chore to do so.  Pt feeling generalized weakness and has been stumbling more than normal.  Pt reports he feels more numbness on the R side of the body, some in the LE, mostly in the R hands and in the R side of the face.  Pt note she has some difficulty with drooling and "I've run out of jokes about it". Pt accompanied by: significant other  PERTINENT HISTORY:  68 y.o. male presents to  [facility] hospital on 03/12/2024 with sudden onset R numbness and dysarthria. CT head reveals acute infarct in L thalamus, subacute infarct in R thalamus. CTA notable for [facility] over the L parietal convexity. PMH includes HTN, HLD, CKD III.   PAIN:  Are you having pain? No  PRECAUTIONS: None  RED FLAGS: None   WEIGHT BEARING RESTRICTIONS: No  FALLS: Has patient fallen in last 6 months? No  LIVING ENVIRONMENT: Lives with: lives with their family Lives in: Mobile home Stairs: Yes: External:  5 steps; can reach both Has following equipment at home: Single point cane, Walker - 2 wheeled, and Walker - 4 wheeled  PLOF: Independent  PATIENT GOALS: Pt just wants to feel "normal" again.    OBJECTIVE:  Note: Objective measures were completed at Evaluation unless otherwise noted.  DIAGNOSTIC FINDINGS:  EXAM: CT HEAD WITHOUT CONTRAST  IMPRESSION: 1. Relatively stable small hyperdense focus involving the left parietal convexity. Upon review of prior MRI, there is question that this could potentially reflect a small cavernoma, although a small focus of acute hemorrhage remains difficult to exclude. Continued short interval follow-up to evaluate for stability versus resolution of this finding would likely be helpful for further evaluation. 2. Acute left thalamic lacunar infarct, better seen on prior MRI. 3. No other new acute intracranial abnormality.  COGNITION: Overall cognitive status: Within functional limits for tasks assessed   SENSATION: Light touch: Impaired on the R side, specifically with C5-C7 noting increased numbness along those distribution pathways.   COORDINATION: WFL  POSTURE: rounded shoulders    (Blank rows = not tested)  LOWER EXTREMITY MMT:    MMT Right Eval Left Eval  Hip flexion 5 5  Hip extension    Hip abduction 5 5  Hip adduction 5 5  Hip internal rotation    Hip external rotation    Knee flexion 4+ 5  Knee extension 5 5  Ankle dorsiflexion 5 5   Ankle plantarflexion    Ankle inversion    Ankle eversion    (Blank rows = not tested)  STAIRS: Findings: Stair Negotiation Technique: Alternating Pattern  with Bilateral Rails, Number of Stairs: 4, Height of Stairs: 6'   , and Comments: stable with performance  GAIT: Findings: Distance walked: 90' and Comments: Trendelenburg on the R LE, noting some weakness in the R gluteal musculature  FUNCTIONAL TESTS:  5 times sit to stand: 16.45 sec Timed up and go (TUG): 13.19 sec 10 meter walk test: 10.16 sec; 0.98 m/sec Dynamic Gait Index: TBD Functional gait assessment: TBD  PATIENT SURVEYS:  Stroke Impact Scale 82.5% ABC scale 71.9%                                                                                                                              TREATMENT DATE: 03/28/24  Neuro:  Static stance on Airex pad, 30 sec bouts Static stance on Airex pad, eyes closed 30 sec bouts Static NBOS on Airex pad, 30 sec bouts Static NBOS on Airex pad, eyes closed, 30 sec bouts Static staggered stance on Airex pad, 30 sec bouts Static staggered stance on Airex pad, eyes closed, 30 sec bouts attempted, however able to achieve 15 sec before needing to readjust  Lateral stepping on airex balance beam, x10 down and back Forward ambulation on airex balance beam, x10 each direction Pt performed one backwards ambulation on the airex balance beam without much difficulty   TherAct:  Ambulation for 1400' with 8# AW  donned for increased endurance and functional strength necessary for performing tasks around the house    PATIENT EDUCATION: Education details: Pt educated on role of PT and services provided during current POC, along with prognosis and information about the clinic. Person educated: Patient and Spouse Education method: Explanation, Demonstration, and Handouts Education comprehension: verbalized understanding, returned demonstration, verbal cues required, and tactile cues  required  HOME EXERCISE PROGRAM: Access Code: [email or web address] URL: [email or web address] Date: 03/21/2024 Prepared by: Aurora Lees  Exercises - Supine Bridge  - 1 x daily - 7 x weekly - 3 sets - 10 reps - Clamshell  - 1 x daily - 7 x weekly - 3 sets - 10 reps - Sidelying Hip Abduction  - 1 x daily - 7 x weekly - 3 sets - 10 reps - Standing Hip Abduction  - 1 x daily - 7 x weekly - 3 sets - 10 reps - Single Leg Balance with Eyes Closed  - 1 x daily - 5 x weekly - 3 sets - 4 reps - 10seconds hold - Tandem Stance with Eyes Closed  - 1 x daily - 5 x weekly - 3 sets - 4 reps - 20 seconds  hold  GOALS: Goals reviewed with patient? Yes  SHORT TERM GOALS: Target date: 06/08/2024  Pt will be independent with HEP in order to demonstrate increased ability to perform tasks related to occupation/hobbies. Baseline: Goal status: INITIAL  LONG TERM GOALS: Target date: 06/08/2024  1.  Patient (> 7 years old) will complete five times sit to stand test in < 15 seconds indicating an increased LE strength and improved balance. Baseline: 16.45 sec Goal status: INITIAL  2.  Patient will increase ABC score to equal to or greater than 10% to demonstrate statistically significant improvement in mobility and quality of life.  Baseline: 71.9% Goal status: INITIAL   3.  Patient will increase Minibest  score by > 3 points to demonstrate decreased fall risk during functional activities. Baseline: 24/28 Goal status: INITIAL   4.  Patient will reduce timed up and go to <11 seconds to reduce fall risk and demonstrate improved transfer/gait ability. Baseline: 13.19 sec Goal status: INITIAL  5.  Patient will increase 10 meter walk test to >1.76m/s as to improve gait speed for better community ambulation and to reduce fall risk. Baseline: 10.16 sec; 0.98 m/s Goal status: INITIAL  6.  Patient will increase six minute walk test distance by 172ft  for progression to community ambulator and improve gait  ability Baseline: 1315' Goal status: adjusted; INITIAL  7.  Patient will increase SIS-16 score to equal to or greater than 10% to demonstrate statistically significant improvement in mobility and quality of life.  Baseline: 82.5% Goal status: INITIAL    ASSESSMENT:  CLINICAL IMPRESSION: ***   Pt will continue to benefit from skilled therapy to address remaining deficits in order to improve overall QoL and return to PLOF.      OBJECTIVE IMPAIRMENTS: Abnormal gait, decreased activity tolerance, decreased balance, decreased mobility, and decreased strength.   ACTIVITY LIMITATIONS: carrying, lifting, bending, squatting, reach over head, and hygiene/grooming  PARTICIPATION LIMITATIONS: cleaning, driving, shopping, community activity, and yard work  PERSONAL FACTORS: Past/current experiences, Time since onset of injury/illness/exacerbation, and 1-2 comorbidities: smoking, acute ischemic stroke are also affecting patient's functional outcome.   REHAB POTENTIAL: Good  CLINICAL DECISION MAKING: Evolving/moderate complexity  EVALUATION COMPLEXITY: Moderate  PLAN:  PT FREQUENCY: 1-2x/week  PT DURATION: 12 weeks  PLANNED INTERVENTIONS: 97750- Physical Performance Testing,  97110-Therapeutic exercises, 97530- Therapeutic activity, [id number]- Neuromuscular re-education, [phone]- Self Care, [id number]- Manual therapy, [phone]- Gait training, Patient/Family education, Balance training, Stair training, and Dry Needling  PLAN FOR NEXT SESSION:   Assess UE for potential referral to OT? High level balance with eyes closed. RLE strengthening as indicated.    Eryn Krejci  Brain Cahill, PT, DPT Physical Therapist - [facility]  Outpatient Physical Therapy- Main Campus [phone]    03/28/24, 3:25 PM

## 2024-03-29 ENCOUNTER — Ambulatory Visit

## 2024-03-29 DIAGNOSIS — R29898 Other symptoms and signs involving the musculoskeletal system: Secondary | ICD-10-CM

## 2024-03-29 DIAGNOSIS — R29810 Facial weakness: Secondary | ICD-10-CM

## 2024-03-29 DIAGNOSIS — R262 Difficulty in walking, not elsewhere classified: Secondary | ICD-10-CM

## 2024-03-29 DIAGNOSIS — M6281 Muscle weakness (generalized): Secondary | ICD-10-CM

## 2024-03-29 NOTE — Therapy (Signed)
 OUTPATIENT PHYSICAL THERAPY TREATMENT/DISCHARGE   Patient Name: Sean Reilly MRN: [id number] DOB:1956/01/27, 68 y.o., male Today's Date: 03/29/2024   PCP: Pt states he has one, but does not know her full name, but her first name is Corbin Dess REFERRING PROVIDER: Consuelo Denmark, MD  END OF SESSION:  PT End of Session - 03/29/24 1502     Visit Number 4    Number of Visits 25    Date for PT Re-Evaluation 06/08/24    Authorization Type [employer] Medicare    Progress Note Due on Visit 10    PT Start Time 1500    PT Stop Time 1525    PT Time Calculation (min) 25 min    Equipment Utilized During Treatment Gait belt    Activity Tolerance Patient tolerated treatment well    Behavior During Therapy WFL for tasks assessed/performed             Past Medical History:  Diagnosis Date   Hypertension    No past surgical history on file. Patient Active Problem List   Diagnosis Date Noted   Acute ischemic stroke (HCC) 03/13/2024    ONSET DATE: 03/11/24  REFERRING DIAG: I63.9 (ICD-10-CM) - Cerebrovascular accident (CVA), unspecified mechanism (HCC)   THERAPY DIAG:  Right leg weakness  Weakness of right upper extremity  Facial weakness  Difficulty in walking, not elsewhere classified  Muscle weakness (generalized)  Rationale for Evaluation and Treatment: Rehabilitation  SUBJECTIVE:                                                                                                                                                                                             SUBJECTIVE STATEMENT: Pt report sno updates. He is back to baseline, has no deficits, is not restricted from partiicpating in his typical affairs, activities.    PERTINENT HISTORY:  68 y.o. male presents to [facility] hospital on 03/12/2024 with sudden onset R numbness and dysarthria. CT head reveals acute infarct in L thalamus, subacute infarct in R thalamus. CTA notable for [facility] over the L parietal convexity. PMH includes HTN,  HLD, CKD III.   PAIN:  Are you having pain? No  PRECAUTIONS: None  RED FLAGS: None   WEIGHT BEARING RESTRICTIONS: No  FALLS: Has patient fallen in last 6 months? No  LIVING ENVIRONMENT: Lives with: lives with their family Lives in: Mobile home Stairs: Yes: External: 5 steps; can reach both Has following equipment at home: Single point cane, Walker - 2 wheeled, and [employer] - 4 wheeled  PLOF: Independent  PATIENT GOALS: Pt just wants to feel "normal" again.    OBJECTIVE:  Note: Objective  measures were completed at Evaluation unless otherwise noted.  DIAGNOSTIC FINDINGS:  EXAM: CT HEAD WITHOUT CONTRAST  IMPRESSION: 1. Relatively stable small hyperdense focus involving the left parietal convexity. Upon review of prior MRI, there is question that this could potentially reflect a small cavernoma, although a small focus of acute hemorrhage remains difficult to exclude. Continued short interval follow-up to evaluate for stability versus resolution of this finding would likely be helpful for further evaluation. 2. Acute left thalamic lacunar infarct, better seen on prior MRI. 3. No other new acute intracranial abnormality.  COGNITION: Overall cognitive status: Within functional limits for tasks assessed   SENSATION: Light touch: Impaired on the R side, specifically with C5-C7 noting increased numbness along those distribution pathways.   COORDINATION: WFL  POSTURE: rounded shoulders    (Blank rows = not tested)  LOWER EXTREMITY MMT:    MMT Right Eval Left Eval  Hip flexion 5 5  Hip extension    Hip abduction 5 5  Hip adduction 5 5  Hip internal rotation    Hip external rotation    Knee flexion 4+ 5  Knee extension 5 5  Ankle dorsiflexion 5 5  Ankle plantarflexion    Ankle inversion    Ankle eversion    (Blank rows = not tested)  STAIRS: Findings: Stair Negotiation Technique: Alternating Pattern  with Bilateral Rails, Number of Stairs: 4, Height of  Stairs: 6'   , and Comments: stable with performance  GAIT: Findings: Distance walked: 49' and Comments: Trendelenburg on the R LE, noting some weakness in the R gluteal musculature  FUNCTIONAL TESTS:  5 times sit to stand: 16.45 sec Timed up and go (TUG): 13.19 sec 10 meter walk test: 10.16 sec; 0.98 m/sec Dynamic Gait Index: TBD Functional gait assessment: TBD  PATIENT SURVEYS:  Stroke Impact Scale 82.5% ABC scale 71.9%                                                                                                                             TREATMENT DATE: 03/29/24 -overground AMB no device 454ft -Fine motor step taps 1x20, alternating, no support (does not knock target over)  -soccer ball dribble rebounding 2x12 on each foot (quick reactive foot work without any LOB)  -airex pad stance 6lb med ball toss/catch and/or ball slam: 10x fwd, 10x 45 degree right, 45 degree Left  -AMB with 4" step up step down, 180 degree turnaround: with 25lb bulgarian bag across shoulders x12, then 1x15lb KB in each hand x12 *pt does a fantastic job with all of these, no LOB seen in session- pt denies any particular deficits that he is aware of.   PATIENT EDUCATION: Education details: Indication for DC.  Person educated: Patient  Education method: Explanation, Demonstration, and Handouts Education comprehension: verbalized understanding, returned demonstration, verbal cues required, and tactile cues required  HOME EXERCISE PROGRAM: Access Code: [email or web address] URL: [email or web address] Date: 03/21/2024 Prepared by: Aurora Lees  Exercises - Supine Bridge  -  1 x daily - 7 x weekly - 3 sets - 10 reps - Clamshell  - 1 x daily - 7 x weekly - 3 sets - 10 reps - Sidelying Hip Abduction  - 1 x daily - 7 x weekly - 3 sets - 10 reps - Standing Hip Abduction  - 1 x daily - 7 x weekly - 3 sets - 10 reps - Single Leg Balance with Eyes Closed  - 1 x daily - 5 x weekly - 3 sets - 4 reps - 10seconds  hold - Tandem Stance with Eyes Closed  - 1 x daily - 5 x weekly - 3 sets - 4 reps - 20 seconds  hold  GOALS: Goals reviewed with patient? Yes  SHORT TERM GOALS: Target date: 06/08/2024  Pt will be independent with HEP in order to demonstrate increased ability to perform tasks related to occupation/hobbies. Baseline: Goal status: MET  LONG TERM GOALS: Target date: 06/08/2024  1.  Patient (> 46 years old) will complete five times sit to stand test in < 15 seconds indicating an increased LE strength and improved balance. Baseline: 16.45 sec Goal status: MET  2.  Patient will increase ABC score to equal to or greater than 10% to demonstrate statistically significant improvement in mobility and quality of life.  Baseline: 71.9% Goal status: MET   3.  Patient will increase Minibest  score by > 3 points to demonstrate decreased fall risk during functional activities. Baseline: 24/28 Goal status: MET   4.  Patient will reduce timed up and go to <11 seconds to reduce fall risk and demonstrate improved transfer/gait ability. Baseline: 13.19 sec Goal status: MET  5.  Patient will increase 10 meter walk test to >1.86m/s as to improve gait speed for better community ambulation and to reduce fall risk. Baseline: 10.16 sec; 0.98 m/s Goal status: MET  6.  Patient will increase six minute walk test distance by 115ft  for progression to community ambulator and improve gait ability Baseline: 1315' Goal status: adjusted; deferred   7.  Patient will increase SIS-16 score to equal to or greater than 10% to demonstrate statistically significant improvement in mobility and quality of life.  Baseline: 82.5% Goal status: deferred  ASSESSMENT:  CLINICAL IMPRESSION: Pt has no remaining deficits at this time, demonstrates excellent tolerance and performance with all interventions. All goals met at this time. No remaining indication for physical therapy at this time. Pt agreeable to DC at this time.     OBJECTIVE IMPAIRMENTS: Abnormal gait, decreased activity tolerance, decreased balance, decreased mobility, and decreased strength.   ACTIVITY LIMITATIONS: carrying, lifting, bending, squatting, reach over head, and hygiene/grooming  PARTICIPATION LIMITATIONS: cleaning, driving, shopping, community activity, and yard work  PERSONAL FACTORS: Past/current experiences, Time since onset of injury/illness/exacerbation, and 1-2 comorbidities: smoking, acute ischemic stroke are also affecting patient's functional outcome.   REHAB POTENTIAL: Good  CLINICAL DECISION MAKING: Evolving/moderate complexity  EVALUATION COMPLEXITY: Moderate  PLAN:  PT FREQUENCY: 1-2x/week  PT DURATION: 12 weeks  PLANNED INTERVENTIONS: 97750- Physical Performance Testing, 97110-Therapeutic exercises, 97530- Therapeutic activity, 97112- Neuromuscular re-education, 97535- Self Care, 19147- Manual therapy, [phone]- Gait training, Patient/Family education, Balance training, Stair training, and Dry Needling  PLAN FOR NEXT SESSION:    3:05 PM, 03/29/24 Dawn Eth, PT, DPT Physical Therapist - [facility]  Outpatient Physical Therapy- Main Campus [phone]

## 2024-04-03 ENCOUNTER — Ambulatory Visit

## 2024-04-04 ENCOUNTER — Ambulatory Visit: Admitting: Physical Therapy

## 2024-04-10 ENCOUNTER — Ambulatory Visit: Admitting: Physical Therapy

## 2024-04-12 ENCOUNTER — Ambulatory Visit

## 2024-04-12 NOTE — Progress Notes (Addendum)
 [facility] [address] [place]. [place] [address] [phone]       HOSPITAL FOLLOW UP NOTE  [name] Date of Birth:  [birth year] Medical Record Number:  [id number]   Reason for Referral:  hospital stroke follow up    SUBJECTIVE:   CHIEF COMPLAINT:  Chief Complaint  Patient presents with   Cerebrovascular Accident    Rm 3 alone Pt is well reports residual R sided numbness.  He also mentions fatigue after taking medication. No other concerns     HPI:   [name] is a 68 y.o. male with history of hypertension and smoker who presented to ED on 03/12/2024 with right-sided numbness, headache and significantly elevated BP.  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  Repeat CT head stable.  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 Further workup below.  Recommended aspirin  81 mg daily, DAPT not recommended due to cavernoma bleeding.  Added metoprolol  in addition to home amlodipine  and lisinopril , advised long-term BP goal <160.  Initiated Crestor  40 mg daily, LDL 177.  Tobacco cessation counseling provided.  Therapies recommended outpatient PT.    Today, 04/13/2024, patient is being seen for initial hospital follow-up unaccompanied.  He reports residual right face and right hand numbness, no significant improvement since discharge.  Denies any residual weakness or balance difficulties.  He can have difficulty holding objects in his right hand but more due to numbness and not weakness.  He has since completed therapy and continues to do exercises at home.  No new stroke/TIA symptoms.  Has returned back to all prior activities.  Reports compliance on aspirin  and Crestor .  Blood pressure today elevated, monitors at home  several times a day, elevated prior to medications but gradually improves, typically SBP 130s midday, reports compliance on metoprolol , amlodipine  and lisinopril  but does complain of increased fatigue after taking.  Denies experiencing fatigue prestroke, denies snoring, witnessed apneas, nocturia or morning headaches.  He has not yet had follow-up with PCP but is hopeful he is able to schedule a visit by the end of this month.  Continued tobacco use but gradually reducing amount, currently smoking 1/4 PPD, prior to stroke smoking 1 PPD.  His goal is to eventually quit.      PERTINENT IMAGING  MRI  Left thalamic infarct with small right PLIC infarct, focal susceptibility over left parietal convexity corresponding to [facility] noted on CTA, right frontal DVA, multiple remote infarcts in bilateral CR, BG and thalami, left genu of corpus callosum and bilateral cerebellum. CTA head and neck left A2 moderate stenosis, right VA origin short segment occlusion CTH left parietal high density is a cavernoma based on 2012 brain MRI 2D Echo EF 55 to 60% LDL 177 HgbA1c 5.4 UDS negative    ROS:   14 system review of systems performed and negative with exception of those listed in HPI  PMH:  Past Medical History:  Diagnosis Date   Hypertension     PSH: History reviewed. No pertinent surgical history.  Social History:  Social History   Socioeconomic History   Marital status: Married    Spouse name: Not on file   Number of children: Not on file   Years of education: Not on file   Highest education level: Not  on file  Occupational History   Not on file  Tobacco Use   Smoking status: Every Day    Current packs/day: 1.00    Average packs/day: 1 pack/day for 45.0 years (45.0 ttl pk-yrs)    Types: Cigarettes    Passive exposure: Current   Smokeless tobacco: Never  Vaping Use   Vaping status: Not on file  Substance and Sexual Activity   Alcohol use: Not Currently   Drug use: Never   Sexual  activity: Yes    Birth control/protection: None  Other Topics Concern   Not on file  Social History Narrative   Not on file   Social Drivers of Health   Financial Resource Strain: Not on file  Food Insecurity: No Food Insecurity (03/13/2024)   Hunger Vital Sign    Worried About Running Out of Food in the Last Year: Never true    Ran Out of Food in the Last Year: Never true  Transportation Needs: No Transportation Needs (03/13/2024)   PRAPARE - Administrator, Civil Service (Medical): No    Lack of Transportation (Non-Medical): No  Physical Activity: Not on file  Stress: Not on file  Social Connections: Moderately Isolated (03/13/2024)   Social Connection and Isolation Panel    Frequency of Communication with Friends and Family: Twice a week    Frequency of Social Gatherings with Friends and Family: Once a week    Attends Religious Services: Never    Database administrator or Organizations: No    Attends Banker Meetings: Never    Marital Status: Married  Catering manager Violence: Not At Risk (03/13/2024)   Humiliation, Afraid, Rape, and Kick questionnaire    Fear of Current or Ex-Partner: No    Emotionally Abused: No    Physically Abused: No    Sexually Abused: No    Family History: History reviewed. No pertinent family history.  Medications:   Current Outpatient Medications on File Prior to Visit  Medication Sig Dispense Refill   amLODipine  (NORVASC ) 10 MG tablet Take 1 tablet (10 mg total) by mouth daily. 30 tablet 3   aspirin  EC 81 MG tablet Take 1 tablet (81 mg total) by mouth daily. Swallow whole.     lisinopril  (ZESTRIL ) 20 MG tablet Take 1 tablet (20 mg total) by mouth daily. 30 tablet 3   metoprolol  succinate (TOPROL -XL) 25 MG 24 hr tablet Take 0.5 tablets (12.5 mg total) by mouth daily. 30 tablet 3   rosuvastatin  (CRESTOR ) 40 MG tablet Take 1 tablet (40 mg total) by mouth daily. 30 tablet 3   nicotine  (NICODERM CQ  - DOSED IN MG/24 HOURS) 14  mg/24hr patch Place 1 patch (14 mg total) onto the skin daily. (Patient not taking: Reported on 04/13/2024) 28 patch 0   nicotine  polacrilex (NICORETTE ) 2 MG gum Take 1 each (2 mg total) by mouth as needed for smoking cessation. (Patient not taking: Reported on 04/13/2024) 100 tablet 0   No current facility-administered medications on file prior to visit.    Allergies:  No Known Allergies    OBJECTIVE:  Physical Exam  Vitals:   04/13/24 1022 04/13/24 1023  BP: (!) 170/85 (!) 162/84  Pulse: 65 65  Weight: 180 lb (81.6 kg)   Height: 5' 8 (1.727 m)    Body mass index is 27.37 kg/m. No results found.   General: well developed, well nourished, very pleasant elderly Caucasian male, seated, in no evident distress Head: head normocephalic and atraumatic.  Neck: supple with no carotid or supraclavicular bruits Cardiovascular: regular rate and rhythm, no murmurs Musculoskeletal: no deformity Skin:  no rash/petichiae Vascular:  Normal pulses all extremities   Neurologic Exam Mental Status: Awake and fully alert.  Fluent speech and language.  Oriented to place and time. Recent and remote memory intact. Attention span, concentration and fund of knowledge appropriate. Mood and affect appropriate.  Cranial Nerves: Pupils equal, briskly reactive to light. Extraocular movements full without nystagmus. Visual fields full to confrontation. Hearing intact.  Odd sensation with light touch over lower right face.  Mild right facial asymmetry.  Tongue, palate moves normally and symmetrically.  Motor: Normal bulk and tone. Normal strength in all tested extremity muscles Sensory.: intact to touch , pinprick , position and vibratory sensation except odd sensation with light touch over RUE distally Coordination: Rapid alternating movements normal in all extremities. Finger-to-nose and heel-to-shin performed accurately bilaterally. Gait and Station: Arises from chair without difficulty. Stance is normal.  Gait demonstrates normal stride length and balance without use of AD. Tandem walk and heel toe without difficulty.  Reflexes: 1+ and symmetric. Toes downgoing.     NIHSS  2 Modified Rankin  1      ASSESSMENT: Sean Reilly is a 68 y.o. 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 Vascular risk factors include HTN, HLD, tobacco use, left parietal cavernoma, right frontal DVA and prior strokes on imaging.      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  Discussed typical recovery time, additional information provided for sensory reeducation. Will f/u with Dr. Janett Medin to see if repeat imaging if indicated at this time ADDENDUM 04/18/2024 JM: Per Dr. Janett Medin, no repeat imaging needed unless new symptoms should arise.  Continue aspirin  81mg  daily and rosuvastatin  (Crestor ) for secondary stroke prevention managed/prescribed by PCP.   Discussed importance of complete tobacco cessation as continued use greatly increases risk of additional strokes and other health related conditions.  Congratulated on accomplishment thus far. Discussed secondary stroke prevention measures and importance of close PCP follow up for aggressive stroke risk factor management including BP goal<130/90 (discussed importance of adequate blood pressure control and medication compliance), and HLD with LDL goal<70  Advised to ensure he schedules follow-up visit with PCP, discussed gradually switching antihypertensives to nighttime to see if this helps with fatigue after taking.  Stroke labs 03/2024: LDL 177, A1c 5.4 I have gone over the pathophysiology of stroke, warning signs and symptoms, risk factors and their management in some detail with instructions to go to the closest emergency room for symptoms of  concern.     No further recommendations from stroke standpoint and plans on scheduling follow-up visit with PCP.  Patient can follow-up on an as-needed basis at this time but advised to call with any questions or concerns in the future   CC:  [facility] provider: Dr. Janett Medin PCP: Pcp, No    I personally spent a total of 55 minutes in the care of the patient today including preparing to see the patient, getting/reviewing separately obtained history, performing a medically appropriate exam/evaluation, counseling and educating, and documenting clinical information in the EHR. Time also spend reviewing recent hospitalization.  Johny Nap, AGNP-BC  [facility] [address] [place], [place] [address]  Phone [phone] Fax [phone] Note:  This document was prepared with digital dictation and possible smart phrase technology. Any transcriptional errors that result from this process are unintentional.

## 2024-04-13 ENCOUNTER — Encounter: Payer: Self-pay | Admitting: Adult Health

## 2024-04-13 ENCOUNTER — Ambulatory Visit: Admitting: Adult Health

## 2024-04-13 VITALS — BP 162/84 | HR 65 | Ht 68.0 in | Wt 180.0 lb

## 2024-04-13 DIAGNOSIS — R29818 Other symptoms and signs involving the nervous system: Secondary | ICD-10-CM | POA: Diagnosis not present

## 2024-04-13 DIAGNOSIS — I639 Cerebral infarction, unspecified: Secondary | ICD-10-CM

## 2024-04-13 DIAGNOSIS — Z8673 Personal history of transient ischemic attack (TIA), and cerebral infarction without residual deficits: Secondary | ICD-10-CM | POA: Diagnosis not present

## 2024-04-13 DIAGNOSIS — Z09 Encounter for follow-up examination after completed treatment for conditions other than malignant neoplasm: Secondary | ICD-10-CM

## 2024-04-13 DIAGNOSIS — Q283 Other malformations of cerebral vessels: Secondary | ICD-10-CM | POA: Diagnosis not present

## 2024-04-13 DIAGNOSIS — Z720 Tobacco use: Secondary | ICD-10-CM

## 2024-04-13 NOTE — Patient Instructions (Addendum)
 Can try compression glove on right hand to help with numbness sensation. Do over stimulation exercises which can help symptoms gradually improve  Try to gradually transition some of your blood pressure medications to night time and ensure you follow up with your PCP for ongoing monitoring and management of this   Continue to work on gradually decreasing tobacco use and continued use greatly increases risk of recurrent strokes and other health related concerns  Will follow up with Dr. 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   Signs of a Stroke? Follow the BEFAST method:  Balance Watch for a sudden loss of balance, trouble with coordination or vertigo Eyes Is there a sudden loss of vision in one or both eyes? Or double vision?  Face: Ask the person to smile. Does one side of the face droop or is it numb?  Arms: Ask the person to raise both arms. Does one arm drift downward? Is there weakness or numbness of a leg? Speech: Ask the person to repeat a simple phrase. Does the speech sound slurred/strange? Is the person confused ? Time: If you observe any of these signs, call 911.        Thank you for coming to see us  at [facility]. I hope we have been able to provide you high quality care today.  You may receive a patient satisfaction survey over the next few weeks. We would appreciate your feedback and comments so that we may continue to improve ourselves and the health of our patients.     Sensory Loss After a  Stroke A stroke can damage the parts of your brain that control your body's senses. This may affect your ability to see, taste, swallow, smell, or feel touch or pressure. You may also have trouble feeling temperature changes or moving your body in a coordinated way. You may have problems with all of your senses or only some of them. A treatment plan may help you get some of your lost senses back. It can also help you manage the changes to your lifestyle. How does a stroke affect me? You may have trouble swallowing after a stroke. This may be because of: Changes in your muscles. Sensory changes. These may include: Trouble feeling the consistency or size of a piece of food in your mouth. Not feeling the need to clear your throat. Your risk of falling is higher after a stroke. You may have trouble feeling your legs and feet or coordinating your movements. You may also have trouble doing basic tasks. What actions can I take to manage my condition? Work with your health care provider Work with your health care provider to come up with a treatment plan. Ask your provider if you might benefit from: Physical, occupational, or speech therapy. The use of a device to help you move around (assistive device), such as a wheelchair or walker. Change how you eat and drink  Follow instructions from your provider about what you may eat and drink. If you have trouble swallowing: Take smaller bites. Chew your food well. Make sure  you have swallowed all the food in your mouth before you take another bite. Sit upright when you eat or drink. Avoid distractions. Stay upright for 30-45 minutes after you eat. Change the texture of some foods and drinks. This may include: Eating foods that have been made smooth or mashed (pureed). Using thickening liquids. Prevent falls Your risk of falls is higher after a stroke. To lower your risk of falls: Wear eyeglasses when you move around. Turn on lights, or use night  lights, to avoid having to move around in the dark. Use grab bars in bathrooms and handrails in stairways. Keep walkways clear in your home. This may mean removing area rugs, cords, and clutter from the floor. Prevent burns Your risk of getting burned is higher after a stroke. To lower your risk of burns: Test the water temperature before you take a shower or wash your hands. Allow hot foods to cool slightly before eating. Use potholders when you handle hot pans. Manage stress A stroke is a life-changing event that can cause a lot of stress. Talk with your provider if you feel overwhelmed. You may want to: Join a local support group. Ask your provider or physical therapist about groups near you. Find a Veterinary surgeon. Exercise often. This can help relieve stress. It can also help you sleep better. Follow these instructions at home: Activity Do physical and occupational therapy. These can help your recovery. Avoid spending too much time sitting or lying down. If you must be in a chair or bed, change positions often. Return to your normal activities as told by your provider. Ask your provider what activities are safe for you. Lifestyle Get help at home as needed. You may need help getting dressed, bathing, using the bathroom, eating, or doing other activities. Do not use any products that contain nicotine  or tobacco. These products include cigarettes, chewing tobacco, and vaping devices, such as e-cigarettes. If you need help quitting, ask your provider. Do not drink alcohol. Safety If one hand was affected by your stroke, do not use that hand to handle sharp objects, such as scissors or knives. Use your healthy hand instead. Infection signs Check the side affected by the stroke every day for cuts, bruises, or injuries that you did not feel. If you do have a cut or wound, check it for signs of infection. Check for: Redness or swelling. Fluid or blood. Warmth. Pus or a bad smell. General  instructions Take over-the-counter and prescription medicines only as told by your provider. Wear arm or leg braces as told by your provider. Where to find support To find a local support group, search the American Stroke Association group finder: stroke.org Contact a health care provider if: You have a wound or burn that will not heal. You have any signs of infection in a wound or cut. You are unsteady and almost fall. You have side effects from your medicines. You become depressed or anxious. Get help right away if: You have an allergic reaction to your medicine. You fall while you are taking blood thinners. This can lead to serious bleeding in or around the brain. You have symptoms that may mean a new stroke. These may include weakness or numbness of your arm or leg, speech changes, or facial drooping. These symptoms may be an emergency. Get help right away. Call 911. Do not wait to see if the symptoms will go away. Do not drive yourself to the hospital. Also, get help right away if: You have feelings of  hurting yourself or others. Take one of these steps if you feel like you may hurt yourself or others, or have thoughts about taking your own life: Go to your nearest emergency room. Call 911. Call the National Suicide Prevention Lifeline at (204)280-1068 or 988. This is open 24 hours a day. Text the Crisis Text Line at 931 510 2711. This information is not intended to replace advice given to you by your health care provider. Make sure you discuss any questions you have with your health care provider. Document Revised: 08/10/2022 Document Reviewed: 08/10/2022 Elsevier Patient Education  2024 [employer].

## 2024-04-18 ENCOUNTER — Ambulatory Visit

## 2024-04-20 ENCOUNTER — Ambulatory Visit: Admitting: Physical Therapy

## 2024-04-20 ENCOUNTER — Encounter: Payer: Self-pay | Admitting: Adult Health

## 2024-04-25 ENCOUNTER — Ambulatory Visit

## 2024-04-28 ENCOUNTER — Ambulatory Visit: Admitting: Physical Therapy

## 2024-05-04 ENCOUNTER — Ambulatory Visit

## 2024-05-09 ENCOUNTER — Ambulatory Visit

## 2024-05-12 ENCOUNTER — Ambulatory Visit: Admitting: Physical Therapy

## 2024-05-16 ENCOUNTER — Ambulatory Visit

## 2024-05-18 ENCOUNTER — Ambulatory Visit

## 2024-06-05 ENCOUNTER — Encounter: Payer: Self-pay | Admitting: Urology

## 2024-08-21 ENCOUNTER — Telehealth: Payer: Self-pay

## 2024-08-21 ENCOUNTER — Other Ambulatory Visit: Payer: Self-pay

## 2024-08-21 DIAGNOSIS — R972 Elevated prostate specific antigen [PSA]: Secondary | ICD-10-CM

## 2024-08-21 NOTE — Telephone Encounter (Signed)
 Secure chat received from Michelle [facility] tech who states that this pt was a difficult blood draw and may not have obtained enough blood for the sample to be run.

## 2024-08-22 LAB — PSA TOTAL (REFLEX TO FREE): Prostate Specific Ag, Serum: 14.8 ng/mL — ABNORMAL HIGH (ref 0.0–4.0)

## 2024-08-23 ENCOUNTER — Ambulatory Visit: Admitting: Urology

## 2024-08-23 VITALS — BP 193/78 | HR 73 | Ht 68.0 in | Wt 169.0 lb

## 2024-08-23 DIAGNOSIS — R972 Elevated prostate specific antigen [PSA]: Secondary | ICD-10-CM

## 2024-08-23 DIAGNOSIS — N184 Chronic kidney disease, stage 4 (severe): Secondary | ICD-10-CM

## 2024-08-23 DIAGNOSIS — N138 Other obstructive and reflux uropathy: Secondary | ICD-10-CM

## 2024-08-23 DIAGNOSIS — R399 Unspecified symptoms and signs involving the genitourinary system: Secondary | ICD-10-CM

## 2024-08-23 DIAGNOSIS — N401 Enlarged prostate with lower urinary tract symptoms: Secondary | ICD-10-CM

## 2024-08-23 LAB — BLADDER SCAN AMB NON-IMAGING

## 2024-08-23 MED ORDER — TAMSULOSIN HCL 0.4 MG PO CAPS
0.4000 mg | ORAL_CAPSULE | Freq: Every day | ORAL | 11 refills | Status: AC
Start: 2024-08-23 — End: ?

## 2024-08-23 NOTE — Progress Notes (Addendum)
   08/23/2024 1:45 PM   Sean Reilly [birth year] [id number]  Reason for visit: Follow up elevated PSA, urinary symptoms, history of prostatitis, CKD  History: Long history of elevated PSA, biopsy January 2022 for PSA of 16.8 showed 74 g prostate, all cores were benign.  Complicated by UTI that did not require admission PSA continued to increase up to 23 in February 2023, prostate MRI March 2023 showed 66 g prostate with small PI-RADS 4 lesion and PI-RADS 3 lesion, fusion biopsy at [facility] showed only benign tissue Cystoscopy November 2022 for urinary symptoms showed large prostate with lateral lobe hypertrophy but no suspicious lesions and cytology was negative CKD stage IV  Physical Exam: BP (!) 193/78 (BP Location: Left Arm, Patient Position: Sitting, Cuff Size: Normal)   Pulse 73   Ht 5' 8 (1.727 m)   Wt 169 lb (76.7 kg)   SpO2 99%   BMI 25.70 kg/m   Imaging/labs: Recent PSA October 2025 down to 14.8, decreased from prior values over the last 3 years  Today: Reports some worsening urinary symptoms with weak stream and straining, interested in medications at this point, Denies any dysuria or gross hematuria PVR  Plan:   Elevated PSA: Chronic, negative biopsy for PSA of 17, PSA increased to 23 and MRI showed small PI-RADS 4 and PI-RADS 3 lesion and fusion biopsy 2023 was also negative.  Most recent PSA down to 14.8.  Will continue yearly monitoring BPH: Trial of Flomax  for urinary symptoms, risks and benefits discussed RTC 1 year PSA prior, PVR   Redell JAYSON Burnet, MD  [facility] [address] [place], [place] [address] [phone]

## 2024-08-24 ENCOUNTER — Ambulatory Visit: Payer: Self-pay | Admitting: Urology

## 2025-08-15 ENCOUNTER — Other Ambulatory Visit

## 2025-08-22 ENCOUNTER — Ambulatory Visit: Admitting: Urology
